# Patient Record
Sex: Female | Born: 1960 | Race: White | Hispanic: No | Marital: Married | Smoking: Never smoker
Health system: Southern US, Community
[De-identification: ages and names within clinical notes are randomized; demographics above are authoritative.]

## PROBLEM LIST (undated history)

## (undated) DIAGNOSIS — D649 Anemia, unspecified: Secondary | ICD-10-CM

## (undated) DIAGNOSIS — G4730 Sleep apnea, unspecified: Secondary | ICD-10-CM

## (undated) DIAGNOSIS — E119 Type 2 diabetes mellitus without complications: Secondary | ICD-10-CM

## (undated) DIAGNOSIS — N189 Chronic kidney disease, unspecified: Secondary | ICD-10-CM

## (undated) DIAGNOSIS — M109 Gout, unspecified: Secondary | ICD-10-CM

## (undated) DIAGNOSIS — M1990 Unspecified osteoarthritis, unspecified site: Secondary | ICD-10-CM

## (undated) DIAGNOSIS — E079 Disorder of thyroid, unspecified: Secondary | ICD-10-CM

## (undated) DIAGNOSIS — I10 Essential (primary) hypertension: Secondary | ICD-10-CM

## (undated) DIAGNOSIS — E039 Hypothyroidism, unspecified: Secondary | ICD-10-CM

## (undated) HISTORY — PX: EYE SURGERY: SHX253

## (undated) HISTORY — PX: WISDOM TOOTH EXTRACTION: SHX21

## (undated) HISTORY — PX: RENAL BIOPSY: SHX156

---

## 2003-12-01 ENCOUNTER — Ambulatory Visit (HOSPITAL_COMMUNITY): Admission: RE | Admit: 2003-12-01 | Discharge: 2003-12-01 | Payer: Self-pay | Admitting: Family Medicine

## 2004-04-09 ENCOUNTER — Other Ambulatory Visit: Admission: RE | Admit: 2004-04-09 | Discharge: 2004-04-09 | Payer: Self-pay | Admitting: Family Medicine

## 2004-12-10 ENCOUNTER — Ambulatory Visit (HOSPITAL_COMMUNITY): Admission: RE | Admit: 2004-12-10 | Discharge: 2004-12-10 | Payer: Self-pay | Admitting: Family Medicine

## 2004-12-24 ENCOUNTER — Encounter: Admission: RE | Admit: 2004-12-24 | Discharge: 2004-12-24 | Payer: Self-pay | Admitting: Family Medicine

## 2005-05-25 ENCOUNTER — Other Ambulatory Visit: Admission: RE | Admit: 2005-05-25 | Discharge: 2005-05-25 | Payer: Self-pay | Admitting: Family Medicine

## 2006-01-02 ENCOUNTER — Ambulatory Visit (HOSPITAL_COMMUNITY): Admission: RE | Admit: 2006-01-02 | Discharge: 2006-01-02 | Payer: Self-pay | Admitting: Family Medicine

## 2006-06-20 ENCOUNTER — Other Ambulatory Visit: Admission: RE | Admit: 2006-06-20 | Discharge: 2006-06-20 | Payer: Self-pay | Admitting: Family Medicine

## 2007-01-12 ENCOUNTER — Ambulatory Visit (HOSPITAL_COMMUNITY): Admission: RE | Admit: 2007-01-12 | Discharge: 2007-01-12 | Payer: Self-pay | Admitting: Family Medicine

## 2007-08-21 ENCOUNTER — Other Ambulatory Visit: Admission: RE | Admit: 2007-08-21 | Discharge: 2007-08-21 | Payer: Self-pay | Admitting: Family Medicine

## 2007-08-28 ENCOUNTER — Ambulatory Visit (HOSPITAL_COMMUNITY): Admission: RE | Admit: 2007-08-28 | Discharge: 2007-08-28 | Payer: Self-pay | Admitting: Family Medicine

## 2008-01-17 ENCOUNTER — Ambulatory Visit (HOSPITAL_COMMUNITY): Admission: RE | Admit: 2008-01-17 | Discharge: 2008-01-17 | Payer: Self-pay | Admitting: Family Medicine

## 2008-09-05 ENCOUNTER — Other Ambulatory Visit: Admission: RE | Admit: 2008-09-05 | Discharge: 2008-09-05 | Payer: Self-pay | Admitting: Family Medicine

## 2009-01-20 ENCOUNTER — Ambulatory Visit (HOSPITAL_COMMUNITY): Admission: RE | Admit: 2009-01-20 | Discharge: 2009-01-20 | Payer: Self-pay | Admitting: Family Medicine

## 2009-09-16 ENCOUNTER — Encounter: Admission: RE | Admit: 2009-09-16 | Discharge: 2009-09-16 | Payer: Self-pay | Admitting: Ophthalmology

## 2010-01-28 ENCOUNTER — Ambulatory Visit (HOSPITAL_COMMUNITY): Admission: RE | Admit: 2010-01-28 | Discharge: 2010-01-28 | Payer: Self-pay | Admitting: Family Medicine

## 2010-10-24 ENCOUNTER — Encounter: Payer: Self-pay | Admitting: Family Medicine

## 2010-11-26 ENCOUNTER — Other Ambulatory Visit (HOSPITAL_COMMUNITY)
Admission: RE | Admit: 2010-11-26 | Discharge: 2010-11-26 | Disposition: A | Discharge status: Home / Self Care | Payer: Commercial Managed Care - PPO | Source: Ambulatory Visit | Attending: Family Medicine | Admitting: Family Medicine

## 2010-11-26 ENCOUNTER — Other Ambulatory Visit: Payer: Self-pay | Admitting: Family Medicine

## 2010-11-26 DIAGNOSIS — Z0000 Encounter for general adult medical examination without abnormal findings: Secondary | ICD-10-CM | POA: Insufficient documentation

## 2010-12-21 ENCOUNTER — Other Ambulatory Visit (HOSPITAL_COMMUNITY): Payer: Self-pay | Admitting: Family Medicine

## 2010-12-21 DIAGNOSIS — Z1231 Encounter for screening mammogram for malignant neoplasm of breast: Secondary | ICD-10-CM

## 2011-02-04 ENCOUNTER — Ambulatory Visit (HOSPITAL_COMMUNITY)
Admission: RE | Admit: 2011-02-04 | Discharge: 2011-02-04 | Disposition: A | Discharge status: Home / Self Care | Payer: Commercial Managed Care - PPO | Source: Ambulatory Visit | Attending: Family Medicine | Admitting: Family Medicine

## 2011-02-04 DIAGNOSIS — Z1231 Encounter for screening mammogram for malignant neoplasm of breast: Secondary | ICD-10-CM | POA: Insufficient documentation

## 2012-01-18 ENCOUNTER — Other Ambulatory Visit (HOSPITAL_COMMUNITY): Payer: Self-pay | Admitting: Family Medicine

## 2012-01-18 DIAGNOSIS — Z1231 Encounter for screening mammogram for malignant neoplasm of breast: Secondary | ICD-10-CM

## 2012-02-14 ENCOUNTER — Ambulatory Visit (HOSPITAL_COMMUNITY): Payer: Commercial Managed Care - PPO

## 2012-03-13 ENCOUNTER — Ambulatory Visit (HOSPITAL_COMMUNITY)
Admission: RE | Admit: 2012-03-13 | Discharge: 2012-03-13 | Disposition: A | Discharge status: Home / Self Care | Payer: Commercial Managed Care - PPO | Source: Ambulatory Visit | Attending: Family Medicine | Admitting: Family Medicine

## 2012-03-13 DIAGNOSIS — Z1231 Encounter for screening mammogram for malignant neoplasm of breast: Secondary | ICD-10-CM | POA: Insufficient documentation

## 2012-10-25 ENCOUNTER — Other Ambulatory Visit: Payer: Self-pay | Admitting: Nephrology

## 2012-10-25 DIAGNOSIS — R3129 Other microscopic hematuria: Secondary | ICD-10-CM

## 2012-11-02 ENCOUNTER — Ambulatory Visit
Admission: RE | Admit: 2012-11-02 | Discharge: 2012-11-02 | Disposition: A | Discharge status: Home / Self Care | Payer: Commercial Managed Care - PPO | Source: Ambulatory Visit | Attending: Nephrology | Admitting: Nephrology

## 2012-11-02 DIAGNOSIS — R3129 Other microscopic hematuria: Secondary | ICD-10-CM

## 2012-11-30 ENCOUNTER — Other Ambulatory Visit (HOSPITAL_COMMUNITY): Payer: Self-pay | Admitting: Nephrology

## 2012-12-06 ENCOUNTER — Other Ambulatory Visit: Payer: Self-pay | Admitting: Radiology

## 2012-12-10 ENCOUNTER — Encounter (HOSPITAL_COMMUNITY): Payer: Self-pay

## 2012-12-14 ENCOUNTER — Encounter (HOSPITAL_COMMUNITY): Payer: Self-pay

## 2012-12-14 ENCOUNTER — Observation Stay (HOSPITAL_COMMUNITY)
Admission: RE | Admit: 2012-12-14 | Discharge: 2012-12-15 | Disposition: A | Discharge status: Home / Self Care | Payer: Commercial Managed Care - PPO | Source: Ambulatory Visit | Attending: Interventional Radiology | Admitting: Interventional Radiology

## 2012-12-14 VITALS — BP 137/92 | HR 96 | Temp 98.1°F | Resp 16 | Ht 71.0 in | Wt 201.7 lb

## 2012-12-14 DIAGNOSIS — N183 Chronic kidney disease, stage 3 unspecified: Secondary | ICD-10-CM | POA: Insufficient documentation

## 2012-12-14 DIAGNOSIS — R031 Nonspecific low blood-pressure reading: Secondary | ICD-10-CM | POA: Insufficient documentation

## 2012-12-14 DIAGNOSIS — N059 Unspecified nephritic syndrome with unspecified morphologic changes: Principal | ICD-10-CM | POA: Insufficient documentation

## 2012-12-14 DIAGNOSIS — I129 Hypertensive chronic kidney disease with stage 1 through stage 4 chronic kidney disease, or unspecified chronic kidney disease: Secondary | ICD-10-CM | POA: Insufficient documentation

## 2012-12-14 DIAGNOSIS — R809 Proteinuria, unspecified: Secondary | ICD-10-CM | POA: Insufficient documentation

## 2012-12-14 DIAGNOSIS — M109 Gout, unspecified: Secondary | ICD-10-CM | POA: Insufficient documentation

## 2012-12-14 DIAGNOSIS — E039 Hypothyroidism, unspecified: Secondary | ICD-10-CM | POA: Insufficient documentation

## 2012-12-14 HISTORY — DX: Disorder of thyroid, unspecified: E07.9

## 2012-12-14 HISTORY — DX: Chronic kidney disease, unspecified: N18.9

## 2012-12-14 HISTORY — DX: Essential (primary) hypertension: I10

## 2012-12-14 HISTORY — DX: Hypothyroidism, unspecified: E03.9

## 2012-12-14 HISTORY — DX: Gout, unspecified: M10.9

## 2012-12-14 LAB — APTT: aPTT: 29 seconds (ref 24–37)

## 2012-12-14 LAB — PROTIME-INR: INR: 0.96 (ref 0.00–1.49)

## 2012-12-14 LAB — CBC
MCH: 31.5 pg (ref 26.0–34.0)
MCHC: 35.4 g/dL (ref 30.0–36.0)
MCV: 90.9 fL (ref 78.0–100.0)
Platelets: 200 10*3/uL (ref 150–400)
Platelets: 179 10*3/uL (ref 150–400)
RBC: 3.95 MIL/uL (ref 3.87–5.11)
RBC: 3.4 MIL/uL — ABNORMAL LOW (ref 3.87–5.11)
RDW: 13 % (ref 11.5–15.5)
WBC: 5.7 10*3/uL (ref 4.0–10.5)

## 2012-12-14 LAB — TYPE AND SCREEN
ABO/RH(D): O POS
Antibody Screen: NEGATIVE

## 2012-12-14 LAB — ABO/RH: ABO/RH(D): O POS

## 2012-12-14 LAB — HEMOGLOBIN: Hemoglobin: 9.9 g/dL — ABNORMAL LOW (ref 12.0–15.0)

## 2012-12-14 MED ORDER — HYDROCODONE-ACETAMINOPHEN 5-325 MG PO TABS: 1.0000 | ORAL_TABLET | Freq: Four times a day (QID) | ORAL | Status: DC | PRN

## 2012-12-14 MED ORDER — MIDAZOLAM HCL 2 MG/2ML IJ SOLN
INTRAMUSCULAR | Status: AC | PRN
  Administered 2012-12-14 (×2): 1 mg via INTRAVENOUS

## 2012-12-14 MED ORDER — SODIUM CHLORIDE 0.9 % IV SOLN: INTRAVENOUS | Status: DC

## 2012-12-14 MED ORDER — SODIUM CHLORIDE 0.9 % IV SOLN: Freq: Once | INTRAVENOUS | Status: DC

## 2012-12-14 MED ORDER — LOSARTAN POTASSIUM 50 MG PO TABS
100.0000 mg | ORAL_TABLET | Freq: Every day | ORAL | Status: DC
  Administered 2012-12-14 – 2012-12-15 (×2): 100 mg via ORAL
  Filled 2012-12-14 (×2): qty 2

## 2012-12-14 MED ORDER — ALLOPURINOL 300 MG PO TABS
300.0000 mg | ORAL_TABLET | Freq: Every day | ORAL | Status: DC
  Administered 2012-12-14 – 2012-12-15 (×2): 300 mg via ORAL
  Filled 2012-12-14 (×2): qty 1

## 2012-12-14 MED ORDER — AMLODIPINE BESYLATE 5 MG PO TABS
5.0000 mg | ORAL_TABLET | Freq: Every day | ORAL | Status: DC
  Administered 2012-12-14 – 2012-12-15 (×2): 5 mg via ORAL
  Filled 2012-12-14 (×2): qty 1

## 2012-12-14 MED ORDER — SODIUM CHLORIDE 0.9 % IV SOLN
INTRAVENOUS | Status: DC
  Administered 2012-12-14: 14:00:00 via INTRAVENOUS

## 2012-12-14 MED ORDER — FENTANYL CITRATE 0.05 MG/ML IJ SOLN
INTRAMUSCULAR | Status: AC | PRN
  Administered 2012-12-14: 50 ug via INTRAVENOUS
  Administered 2012-12-14: 25 ug via INTRAVENOUS

## 2012-12-14 MED ORDER — COLCHICINE 0.6 MG PO TABS
0.6000 mg | ORAL_TABLET | Freq: Every day | ORAL | Status: DC | PRN
  Filled 2012-12-14: qty 1

## 2012-12-14 MED ORDER — SODIUM CHLORIDE 0.9 % IV SOLN
INTRAVENOUS | Status: AC | PRN
  Administered 2012-12-14: 20 mL/h via INTRAVENOUS

## 2012-12-14 MED ORDER — FENTANYL CITRATE 0.05 MG/ML IJ SOLN
INTRAMUSCULAR | Status: AC
  Filled 2012-12-14: qty 2

## 2012-12-14 MED ORDER — MIDAZOLAM HCL 2 MG/2ML IJ SOLN
INTRAMUSCULAR | Status: AC
  Filled 2012-12-14: qty 4

## 2012-12-14 MED ORDER — LORATADINE 10 MG PO TABS
10.0000 mg | ORAL_TABLET | Freq: Every day | ORAL | Status: DC | PRN
  Filled 2012-12-14: qty 1

## 2012-12-14 MED ORDER — ADULT MULTIVITAMIN W/MINERALS CH
1.0000 | ORAL_TABLET | Freq: Every day | ORAL | Status: DC
  Administered 2012-12-14: 1 via ORAL
  Filled 2012-12-14 (×2): qty 1

## 2012-12-14 MED ORDER — METOPROLOL TARTRATE 25 MG PO TABS
25.0000 mg | ORAL_TABLET | Freq: Every day | ORAL | Status: DC
  Administered 2012-12-14 – 2012-12-15 (×2): 25 mg via ORAL
  Filled 2012-12-14 (×2): qty 1

## 2012-12-14 NOTE — ED Notes (Signed)
NS at 125cc per hour. Had 500cc NS bolus

## 2012-12-14 NOTE — Procedures (Signed)
US renal core bx R 16g x3 No complication No blood loss. See complete dictation in [facility].

## 2012-12-14 NOTE — H&P (Signed)
Stacey Mullins is an 52 y.o. female.   Chief Complaint: proteinuria Pt has had hx microscopic hematuria for 15 yrs Recent blood work and 24 hr urine : abnormal Referred to Dr Caryn Section for evaluation Scheduled now for random renal biopsy HPI: HTN; gout; hypothyroid  Past Medical History  Diagnosis Date  . Hypertension   . Chronic kidney disease   . Gout     History reviewed. No pertinent past surgical history.  No family history on file. Social History:  has no tobacco, alcohol, and drug history on file.  Allergies:  Allergies  Allergen Reactions  . Benicar (Olmesartan)     Coughing   . Erythromycin     Thrush     (Not in a hospital admission)  Results for orders placed during the hospital encounter of 12/14/12 (from the past 48 hour(s))  CBC     Status: Abnormal   Collection Time    12/14/12  8:37 AM      Result Value Range   WBC 5.7  4.0 - 10.5 K/uL   RBC 3.95  3.87 - 5.11 MIL/uL   Hemoglobin 12.4  12.0 - 15.0 g/dL   HCT 19.1 (*) 47.8 - 29.5 %   MCV 90.9  78.0 - 100.0 fL   MCH 31.4  26.0 - 34.0 pg   MCHC 34.5  30.0 - 36.0 g/dL   RDW 62.1  30.8 - 65.7 %   Platelets 200  150 - 400 K/uL   No results found.  Review of Systems  Constitutional: Negative for fever.  Respiratory: Negative for cough and shortness of breath.   Cardiovascular: Negative for chest pain.  Gastrointestinal: Negative for nausea and vomiting.  Genitourinary: Positive for hematuria.       Proteinuria  Neurological: Negative for headaches.    There were no vitals taken for this visit. Physical Exam  Constitutional: She is oriented to person, place, and time. She appears well-developed and well-nourished.  Cardiovascular: Normal rate, regular rhythm and normal heart sounds.   Respiratory: Effort normal and breath sounds normal. She has no wheezes.  GI: Soft. Bowel sounds are normal. There is no tenderness.  Musculoskeletal: Normal range of motion.  Neurological: She is alert and oriented to  person, place, and time.  Skin: Skin is warm and dry.  Psychiatric: She has a normal mood and affect. Her behavior is normal. Judgment and thought content normal.     Assessment/Plan Proteinuria Scheduled for renal bx today Pt aware of procedure benefits and risks and agreeable to proceed Consent signed and in chart  Tinea Nobile A 12/14/2012, 10:10 AM

## 2012-12-14 NOTE — ED Notes (Addendum)
In nurses station with husband. Report given to Konrad Saha. Bandaid right flank clean dry and intact

## 2012-12-14 NOTE — Progress Notes (Addendum)
Patient ID: Stacey Mullins, female   DOB: 1960/10/25, 52 y.o.   MRN: [id number]   Rt renal hematoma post renal bx  Admitted to IR service overnight for pain control And observation  Plan for dc in am if stable Dr Allena Katz aware

## 2012-12-15 LAB — CBC
HCT: 29.8 % — ABNORMAL LOW (ref 36.0–46.0)
Hemoglobin: 10 g/dL — ABNORMAL LOW (ref 12.0–15.0)
MCH: 31 pg (ref 26.0–34.0)
MCHC: 33.6 g/dL (ref 30.0–36.0)
MCV: 92.3 fL (ref 78.0–100.0)
Platelets: 187 10*3/uL (ref 150–400)
RBC: 3.23 MIL/uL — ABNORMAL LOW (ref 3.87–5.11)
RDW: 13.3 % (ref 11.5–15.5)
WBC: 8.5 10*3/uL (ref 4.0–10.5)

## 2012-12-15 LAB — BASIC METABOLIC PANEL
BUN: 35 mg/dL — ABNORMAL HIGH (ref 6–23)
Creatinine, Ser: 1.37 mg/dL — ABNORMAL HIGH (ref 0.50–1.10)
GFR calc Af Amer: 52 mL/min — ABNORMAL LOW (ref >90)
GFR calc non Af Amer: 44 mL/min — ABNORMAL LOW (ref >90)
Potassium: 4 mEq/L (ref 3.5–5.1)

## 2012-12-15 MED ORDER — LEVOTHYROXINE SODIUM 137 MCG PO TABS
137.0000 ug | ORAL_TABLET | Freq: Every day | ORAL | Status: DC
  Filled 2012-12-15: qty 1

## 2012-12-15 NOTE — Discharge Summary (Signed)
Physician Discharge Summary  Patient ID: Stacey Mullins MRN: [id number] DOB/AGE: 1960/12/16 52 y.o.  Admit date: 12/14/2012 Discharge date: 12/15/2012  Admission Diagnoses: Right perinephric hematoma status post ultrasound guided random core right renal biopsy 12/14/2012  Discharge Diagnoses: Right perinephric hematoma, stable, status post ultrasound guided random core right renal biopsy 12/14/2012 Past Medical History  Diagnosis Date  . Hypertension   . Gout   . Thyroid disease   . Hypothyroid   . Chronic kidney disease     proteinuria     Discharged Condition: good  Hospital Course: Mrs. Stacey Mullins is a 52 year old white female who was referred to the interventional radiology service by Dr. Marina Gravel for ultrasound guided random core renal biopsy. She has a history of hypertension as well as microscopic hematuria, renal insufficiency and proteinuria. On 12/14/2012 the patient underwent ultrasound-guided random core biopsy of the right kidney by Dr. Oley Balm. The procedure was performed via IV conscious sedation. Post procedure the patient was noted to have a right perinephric hematoma on follow up ultrasound. She also complained of mild to moderate abdominal and right flank discomfort. There was no evidence of hematuria. The patient was subsequently admitted to the hospital for overnight observation and IV fluids. She did well overnight with minimal abdominal and right flank pain. Serial hemoglobins were obtained with lowest being 8.8. On the day of discharge , followup hemoglobin was 10.0. Renal function was stable. She complained of 2/10 upper abdominal and right flank discomfort. There was no evidence of gross hematuria or dysuria. Patient denied any nausea or vomiting or chest pain. She tolerated her diet well and was able to void and ambulate without difficulty. Following discussion with Dr. Bonnielee Haff ( interventional radiologist )and  Dr. Allena Katz from the nephrology service, decision was made  to discharge the patient home. Patient was instructed to avoid any heavy lifting for the next 3-4 days and contact Dr. Caryn Section before restarting any blood thinning medication.  She was told to contact  radiology or Dr. Caryn Section with any questions or concerns. She will followup with Dr. Caryn Section as an outpatient regarding renal biopsy results.  Consults: none  Significant Diagnostic Studies:  Results for orders placed during the hospital encounter of 12/14/12  APTT      Result Value Range   aPTT 29  24 - 37 seconds  CBC      Result Value Range   WBC 5.7  4.0 - 10.5 K/uL   RBC 3.95  3.87 - 5.11 MIL/uL   Hemoglobin 12.4  12.0 - 15.0 g/dL   HCT 11.9 (*) 14.7 - 82.9 %   MCV 90.9  78.0 - 100.0 fL   MCH 31.4  26.0 - 34.0 pg   MCHC 34.5  30.0 - 36.0 g/dL   RDW 56.2  13.0 - 86.5 %   Platelets 200  150 - 400 K/uL  PROTIME-INR      Result Value Range   Prothrombin Time 12.7  11.6 - 15.2 seconds   INR 0.96  0.00 - 1.49  CBC      Result Value Range   WBC 10.8 (*) 4.0 - 10.5 K/uL   RBC 3.40 (*) 3.87 - 5.11 MIL/uL   Hemoglobin 10.7 (*) 12.0 - 15.0 g/dL   HCT 78.4 (*) 69.6 - 29.5 %   MCV 88.8  78.0 - 100.0 fL   MCH 31.5  26.0 - 34.0 pg   MCHC 35.4  30.0 - 36.0 g/dL   RDW 28.4  13.2 -  15.5 %   Platelets 179  150 - 400 K/uL  HEMOGLOBIN      Result Value Range   Hemoglobin 9.9 (*) 12.0 - 15.0 g/dL  HEMOGLOBIN      Result Value Range   Hemoglobin 8.8 (*) 12.0 - 15.0 g/dL  CBC      Result Value Range   WBC 8.5  4.0 - 10.5 K/uL   RBC 3.23 (*) 3.87 - 5.11 MIL/uL   Hemoglobin 10.0 (*) 12.0 - 15.0 g/dL   HCT 47.8 (*) 29.5 - 62.1 %   MCV 92.3  78.0 - 100.0 fL   MCH 31.0  26.0 - 34.0 pg   MCHC 33.6  30.0 - 36.0 g/dL   RDW 30.8  65.7 - 84.6 %   Platelets 187  150 - 400 K/uL  BASIC METABOLIC PANEL      Result Value Range   Sodium 141  135 - 145 mEq/L   Potassium 4.0  3.5 - 5.1 mEq/L   Chloride 107  96 - 112 mEq/L   CO2 24  19 - 32 mEq/L   Glucose, Bld 96  70 - 99 mg/dL   BUN 35 (*) 6 - 23 mg/dL    Creatinine, Ser 9.62 (*) 0.50 - 1.10 mg/dL   Calcium 8.5  8.4 - 95.2 mg/dL   GFR calc non Af Amer 44 (*) >90 mL/min   GFR calc Af Amer 52 (*) >90 mL/min  TYPE AND SCREEN      Result Value Range   ABO/RH(D) O POS     Antibody Screen NEG     Sample Expiration 12/17/2012    ABO/RH      Result Value Range   ABO/RH(D) O POS       Treatments: Ultrasound-guided random core right renal biopsy on 12/14/2012 via IV conscious sedation   Discharge Exam: Blood pressure 137/92, pulse 96, temperature 98.1 F (36.7 C), temperature source Oral, resp. rate 16, height 5\' 11"  (1.803 m), weight 201 lb 11.2 oz (91.491 kg), SpO2 97.00%. The patient is awake, alert and oriented. Chest is clear to auscultation with only minimal diminished breath sounds at right base. Heart with regular rate and rhythm. Abdomen soft, positive bowel sounds, mild tenderness noted across upper abdominal/epigastric and right flank regions. Puncture site right flank clean and dry, there is  no  ecchymosis. Extremities with full range of motion and no edema. .  Disposition:Home  Discharge Orders   Future Orders Complete By Expires     Call MD for:  difficulty breathing, headache or visual disturbances  As directed     Call MD for:  extreme fatigue  As directed     Call MD for:  hives  As directed     Call MD for:  persistant dizziness or light-headedness  As directed     Call MD for:  persistant nausea and vomiting  As directed     Call MD for:  redness, tenderness, or signs of infection (pain, swelling, redness, odor or green/yellow discharge around incision site)  As directed     Call MD for:  severe uncontrolled pain  As directed     Call MD for:  temperature >100.4  As directed     Change dressing (specify)  As directed     Comments:      May apply new bandaid over right flank puncture site daily for next 2 days. May wash site with soap and water.    Diet - low sodium  heart healthy  As directed     Discharge instructions   As directed     Comments:      Call radiology dept at [phone] or Dr. Scherrie Gerlach office at [phone] with any questions or concerns. Contact Dr. Scherrie Gerlach office before restarting any blood thinners. May resume usual home medications.    Discharge patient  As directed     Driving Restrictions  As directed     Comments:      No driving for next 48 hours    Increase activity slowly  As directed     Lifting restrictions  As directed     Comments:      No heavy lifting for next 3-4 days        Medication List    TAKE these medications       allopurinol 300 MG tablet  Commonly known as:  ZYLOPRIM  Take 300 mg by mouth daily.     amLODipine 5 MG tablet  Commonly known as:  NORVASC  Take 5 mg by mouth daily.     colchicine 0.6 MG tablet  Take 0.6 mg by mouth daily as needed (for gout flare up).     fluticasone 50 MCG/ACT nasal spray  Commonly known as:  FLONASE  Place 2 sprays into the nose daily as needed for allergies.     loratadine 10 MG tablet  Commonly known as:  CLARITIN  Take 10 mg by mouth daily as needed for allergies.     losartan 100 MG tablet  Commonly known as:  COZAAR  Take 100 mg by mouth daily.     metoprolol tartrate 25 MG tablet  Commonly known as:  LOPRESSOR  Take 25 mg by mouth daily.     multivitamin with minerals Tabs  Take 1 tablet by mouth daily.     Vitamin D3 2000 UNITS capsule  Take 2,000 Units by mouth daily.      May take regularly prescribed synthroid dose.     Follow-up Information   Follow up with Zada Girt, MD. (contact Dr. Caryn Section with any questions or concerns at [phone])    Contact information:   [address] [facility] [place] [address] [phone]       Signed: Chinita Pester 12/15/2012, 11:06 AM

## 2012-12-15 NOTE — Progress Notes (Signed)
UR completed 

## 2012-12-15 NOTE — Progress Notes (Signed)
Pt given discharge instructions.  Taken to ride via wheel chair.  Denied any complaints or needs.

## 2013-01-01 ENCOUNTER — Other Ambulatory Visit (HOSPITAL_COMMUNITY)
Admission: RE | Admit: 2013-01-01 | Discharge: 2013-01-01 | Disposition: A | Discharge status: Home / Self Care | Payer: Commercial Managed Care - PPO | Source: Ambulatory Visit | Attending: Family Medicine | Admitting: Family Medicine

## 2013-01-01 ENCOUNTER — Other Ambulatory Visit: Payer: Self-pay | Admitting: Family Medicine

## 2013-01-01 DIAGNOSIS — Z0000 Encounter for general adult medical examination without abnormal findings: Secondary | ICD-10-CM | POA: Insufficient documentation

## 2013-01-04 ENCOUNTER — Other Ambulatory Visit (HOSPITAL_COMMUNITY): Payer: Self-pay | Admitting: *Deleted

## 2013-01-07 ENCOUNTER — Encounter (HOSPITAL_COMMUNITY)
Admission: RE | Admit: 2013-01-07 | Discharge: 2013-01-07 | Disposition: A | Discharge status: Home / Self Care | Payer: Commercial Managed Care - PPO | Source: Ambulatory Visit | Attending: Nephrology | Admitting: Nephrology

## 2013-01-07 DIAGNOSIS — R809 Proteinuria, unspecified: Secondary | ICD-10-CM | POA: Insufficient documentation

## 2013-01-07 DIAGNOSIS — M109 Gout, unspecified: Secondary | ICD-10-CM | POA: Insufficient documentation

## 2013-01-07 MED ORDER — SODIUM CHLORIDE 0.9 % IV SOLN
500.0000 mg | INTRAVENOUS | Status: DC
  Administered 2013-01-07: 500 mg via INTRAVENOUS
  Filled 2013-01-07: qty 4

## 2013-01-08 ENCOUNTER — Encounter (HOSPITAL_COMMUNITY)
Admission: RE | Admit: 2013-01-08 | Discharge: 2013-01-08 | Disposition: A | Discharge status: Home / Self Care | Payer: Commercial Managed Care - PPO | Source: Ambulatory Visit | Attending: Nephrology | Admitting: Nephrology

## 2013-01-08 MED ORDER — SODIUM CHLORIDE 0.9 % IV SOLN
500.0000 mg | INTRAVENOUS | Status: AC
  Administered 2013-01-08: 500 mg via INTRAVENOUS
  Filled 2013-01-08: qty 4

## 2013-02-14 ENCOUNTER — Other Ambulatory Visit (HOSPITAL_COMMUNITY): Payer: Self-pay | Admitting: Family Medicine

## 2013-02-14 DIAGNOSIS — Z1231 Encounter for screening mammogram for malignant neoplasm of breast: Secondary | ICD-10-CM

## 2013-03-01 ENCOUNTER — Other Ambulatory Visit (HOSPITAL_COMMUNITY): Payer: Self-pay | Admitting: *Deleted

## 2013-03-04 ENCOUNTER — Encounter (HOSPITAL_COMMUNITY)
Admission: RE | Admit: 2013-03-04 | Discharge: 2013-03-04 | Disposition: A | Discharge status: Home / Self Care | Payer: Commercial Managed Care - PPO | Source: Ambulatory Visit | Attending: Nephrology | Admitting: Nephrology

## 2013-03-04 DIAGNOSIS — M109 Gout, unspecified: Secondary | ICD-10-CM | POA: Insufficient documentation

## 2013-03-04 DIAGNOSIS — R809 Proteinuria, unspecified: Secondary | ICD-10-CM | POA: Insufficient documentation

## 2013-03-04 MED ORDER — SODIUM CHLORIDE 0.9 % IV SOLN
500.0000 mg | Freq: Every day | INTRAVENOUS | Status: DC
  Administered 2013-03-04: 500 mg via INTRAVENOUS
  Filled 2013-03-04: qty 4

## 2013-03-05 ENCOUNTER — Encounter (HOSPITAL_COMMUNITY)
Admission: RE | Admit: 2013-03-05 | Discharge: 2013-03-05 | Disposition: A | Discharge status: Home / Self Care | Payer: Commercial Managed Care - PPO | Source: Ambulatory Visit | Attending: Nephrology | Admitting: Nephrology

## 2013-03-05 MED ORDER — SODIUM CHLORIDE 0.9 % IV SOLN
500.0000 mg | Freq: Every day | INTRAVENOUS | Status: DC
  Administered 2013-03-05: 500 mg via INTRAVENOUS
  Filled 2013-03-05: qty 4

## 2013-03-19 ENCOUNTER — Ambulatory Visit (HOSPITAL_COMMUNITY)
Admission: RE | Admit: 2013-03-19 | Discharge: 2013-03-19 | Disposition: A | Discharge status: Home / Self Care | Payer: Commercial Managed Care - PPO | Source: Ambulatory Visit | Attending: Family Medicine | Admitting: Family Medicine

## 2013-03-19 DIAGNOSIS — Z1231 Encounter for screening mammogram for malignant neoplasm of breast: Secondary | ICD-10-CM | POA: Insufficient documentation

## 2013-08-08 ENCOUNTER — Other Ambulatory Visit: Payer: Self-pay

## 2013-08-30 ENCOUNTER — Observation Stay (HOSPITAL_COMMUNITY): Payer: Commercial Managed Care - PPO

## 2013-08-30 ENCOUNTER — Encounter (HOSPITAL_COMMUNITY): Payer: Self-pay | Admitting: Emergency Medicine

## 2013-08-30 ENCOUNTER — Emergency Department (HOSPITAL_COMMUNITY): Payer: Commercial Managed Care - PPO

## 2013-08-30 ENCOUNTER — Inpatient Hospital Stay (HOSPITAL_COMMUNITY)
Admission: EM | Admit: 2013-08-30 | Discharge: 2013-08-31 | DRG: 071 | Disposition: A | Discharge status: Home / Self Care | Payer: Commercial Managed Care - PPO | Attending: Family Medicine | Admitting: Family Medicine

## 2013-08-30 DIAGNOSIS — I519 Heart disease, unspecified: Secondary | ICD-10-CM

## 2013-08-30 DIAGNOSIS — E039 Hypothyroidism, unspecified: Secondary | ICD-10-CM | POA: Diagnosis present

## 2013-08-30 DIAGNOSIS — I10 Essential (primary) hypertension: Secondary | ICD-10-CM | POA: Diagnosis present

## 2013-08-30 DIAGNOSIS — M109 Gout, unspecified: Secondary | ICD-10-CM | POA: Diagnosis present

## 2013-08-30 DIAGNOSIS — G459 Transient cerebral ischemic attack, unspecified: Secondary | ICD-10-CM | POA: Diagnosis present

## 2013-08-30 DIAGNOSIS — N059 Unspecified nephritic syndrome with unspecified morphologic changes: Secondary | ICD-10-CM | POA: Diagnosis present

## 2013-08-30 DIAGNOSIS — E785 Hyperlipidemia, unspecified: Secondary | ICD-10-CM | POA: Diagnosis present

## 2013-08-30 DIAGNOSIS — G454 Transient global amnesia: Principal | ICD-10-CM | POA: Diagnosis present

## 2013-08-30 DIAGNOSIS — R404 Transient alteration of awareness: Secondary | ICD-10-CM

## 2013-08-30 LAB — URINE MICROSCOPIC-ADD ON

## 2013-08-30 LAB — APTT: aPTT: 27 seconds (ref 24–37)

## 2013-08-30 LAB — CBC
Hemoglobin: 12.6 g/dL (ref 12.0–15.0)
MCH: 32.3 pg (ref 26.0–34.0)
Platelets: 181 10*3/uL (ref 150–400)
RBC: 3.9 MIL/uL (ref 3.87–5.11)
WBC: 8.9 10*3/uL (ref 4.0–10.5)

## 2013-08-30 LAB — COMPREHENSIVE METABOLIC PANEL
ALT: 14 U/L (ref 0–35)
Albumin: 3.2 g/dL — ABNORMAL LOW (ref 3.5–5.2)
Alkaline Phosphatase: 81 U/L (ref 39–117)
Chloride: 106 mEq/L (ref 96–112)
Glucose, Bld: 94 mg/dL (ref 70–99)
Potassium: 3.7 mEq/L (ref 3.5–5.1)
Sodium: 142 mEq/L (ref 135–145)
Total Bilirubin: 0.3 mg/dL (ref 0.3–1.2)
Total Protein: 6.3 g/dL (ref 6.0–8.3)

## 2013-08-30 LAB — DIFFERENTIAL
Basophils Relative: 0 % (ref 0–1)
Eosinophils Absolute: 0 10*3/uL (ref 0.0–0.7)
Lymphs Abs: 1.5 10*3/uL (ref 0.7–4.0)
Monocytes Relative: 7 % (ref 3–12)
Neutro Abs: 6.8 10*3/uL (ref 1.7–7.7)
Neutrophils Relative %: 76 % (ref 43–77)

## 2013-08-30 LAB — PROTIME-INR
INR: 1.1 (ref 0.00–1.49)
Prothrombin Time: 14 seconds (ref 11.6–15.2)

## 2013-08-30 LAB — URINALYSIS, ROUTINE W REFLEX MICROSCOPIC
Bilirubin Urine: NEGATIVE
Ketones, ur: NEGATIVE mg/dL
Specific Gravity, Urine: 1.017 (ref 1.005–1.030)
pH: 5 (ref 5.0–8.0)

## 2013-08-30 MED ORDER — METOPROLOL TARTRATE 25 MG PO TABS
25.0000 mg | ORAL_TABLET | Freq: Every day | ORAL | Status: DC
  Administered 2013-08-31: 25 mg via ORAL
  Filled 2013-08-30: qty 1

## 2013-08-30 MED ORDER — ENOXAPARIN SODIUM 40 MG/0.4ML ~~LOC~~ SOLN
40.0000 mg | SUBCUTANEOUS | Status: DC
  Administered 2013-08-30: 40 mg via SUBCUTANEOUS
  Filled 2013-08-30 (×2): qty 0.4

## 2013-08-30 MED ORDER — FLUTICASONE PROPIONATE 50 MCG/ACT NA SUSP
2.0000 | Freq: Every day | NASAL | Status: DC | PRN
  Filled 2013-08-30: qty 16

## 2013-08-30 MED ORDER — ALLOPURINOL 300 MG PO TABS
300.0000 mg | ORAL_TABLET | Freq: Every day | ORAL | Status: DC
Start: 2013-08-30 — End: 2013-08-31
  Administered 2013-08-30 – 2013-08-31 (×2): 300 mg via ORAL
  Filled 2013-08-30 (×2): qty 1

## 2013-08-30 MED ORDER — SENNOSIDES-DOCUSATE SODIUM 8.6-50 MG PO TABS: 1.0000 | ORAL_TABLET | Freq: Every evening | ORAL | Status: DC | PRN

## 2013-08-30 MED ORDER — SODIUM CHLORIDE 0.9 % IV SOLN
INTRAVENOUS | Status: AC
  Administered 2013-08-30: 13:00:00 via INTRAVENOUS

## 2013-08-30 MED ORDER — LEVOTHYROXINE SODIUM 150 MCG PO TABS
150.0000 ug | ORAL_TABLET | Freq: Every day | ORAL | Status: DC
  Administered 2013-08-31: 150 ug via ORAL
  Filled 2013-08-30 (×2): qty 1

## 2013-08-30 NOTE — Progress Notes (Signed)
  Echocardiogram 2D Echocardiogram has been performed.  Stacey Mullins 08/30/2013, 5:15 PM

## 2013-08-30 NOTE — ED Notes (Signed)
Pt sts episode of memory loss today while grocery shopping; pt alert at present; denies any other sx; pt sts recent med change from losartan to valsartan only

## 2013-08-30 NOTE — H&P (Signed)
[facility] History and Physical  WINNA GOLLA ZOX:[id number] DOB: 01/14/1961 DOA: 08/30/2013  Referring physician: EDP    PCP: Cala Bradford, MD  Specialists: none  Chief Complaint: amnesia this morning.   HPI: Stacey Mullins is a 52 y.o. female with h/o hypertension, hypothyroidism, Ig A nephropathycame in to ED after an episode of memory loss around 10 30 this am, and amnesia. As per the patient, she went to the grocery store and after some time, she could not recall where she is and how long she was in the store. She reports the episode lasted for 15 min. She denies any nausea, chest pain, muscle weakness or tingling . She reports some tightness in the back of the neck and some fullness in the ears. Denies similar complaints in the past. Denies stroke in the past . Denies the use of any aspirin. Pt reports memory loss in the past, has a h/o PE at home. Her family at bedside reportsepisodes of expressive aphasia in the recent few months.   Review of Systems: The patient denies anorexia, fever, weight loss,, vision loss, decreased hearing, hoarseness, chest pain, syncope, dyspnea on exertion, peripheral edema, balance deficits, hemoptysis, abdominal pain, melena, hematochezia, severe indigestion/heartburn, hematuria, incontinence, genital sores, muscle weakness, suspicious skin lesions, transient blindness, difficulty walking, depression, unusual weight change, abnormal bleeding, enlarged lymph nodes, angioedema, and breast masses.    Past Medical History  Diagnosis Date  . Hypertension   . Gout   . Thyroid disease   . Hypothyroid   . Chronic kidney disease     proteinuria   History reviewed. No pertinent past surgical history. Social History:  reports that she has never smoked. She has never used smokeless tobacco. She reports that she does not drink alcohol or use illicit drugs.  where does patient live--home,  Allergies  Allergen Reactions  . Benicar [Olmesartan]      Coughing   . Erythromycin     Thrush    Family History  Problem Relation Age of Onset  . Arthritis/Rheumatoid Mother   . Thyroid disease Mother   . Factor V Leiden deficiency Mother   . Hypertension Father    Prior to Admission medications   Medication Sig Start Date End Date Taking? Authorizing Provider  allopurinol (ZYLOPRIM) 300 MG tablet Take 300 mg by mouth daily.   Yes Historical Provider, MD  amLODipine (NORVASC) 5 MG tablet Take 5 mg by mouth daily.   Yes Historical Provider, MD  Cholecalciferol (VITAMIN D3) 2000 UNITS capsule Take 2,000 Units by mouth daily.   Yes Historical Provider, MD  colchicine 0.6 MG tablet Take 0.6 mg by mouth daily as needed (for gout flare up).   Yes Historical Provider, MD  fluticasone (FLONASE) 50 MCG/ACT nasal spray Place 2 sprays into the nose daily as needed for allergies.   Yes Historical Provider, MD  levothyroxine (SYNTHROID, LEVOTHROID) 150 MCG tablet Take 150 mcg by mouth daily before breakfast.   Yes Historical Provider, MD  metoprolol tartrate (LOPRESSOR) 25 MG tablet Take 25 mg by mouth daily.   Yes Historical Provider, MD  Omega-3 Fatty Acids (FISH OIL PO) Take 1 capsule by mouth 2 (two) times daily.   Yes Historical Provider, MD  valsartan (DIOVAN) 320 MG tablet Take 320 mg by mouth daily.   Yes Historical Provider, MD   Physical Exam: Filed Vitals:   08/30/13 1530  BP: 132/88  Pulse: 89  Temp:   Resp: 11    Constitutional: Vital signs reviewed.  Patient is a well-developed and well-nourished  in no acute distress and cooperative with exam. Alert and oriented x3.  Head: Normocephalic and atraumatic Mouth: no erythema or exudates, MMM Eyes: PERRL, EOMI, conjunctivae normal, No scleral icterus.  Neck: Supple, Trachea midline normal ROM, No JVD, mass, thyromegaly, or carotid bruit present.  Cardiovascular: RRR, S1 normal, S2 normal, no MRG, pulses symmetric and intact bilaterally Pulmonary/Chest: normal respiratory effort, CTAB,  no wheezes, rales, or rhonchi Abdominal: Soft. Non-tender, non-distended, bowel sounds are normal, no masses, organomegaly, or guarding present.  Musculoskeletal: No joint deformities, erythema, or stiffness, ROM full and no nontender Neurological: A&O x3, Strength is normal and symmetric bilaterally, cranial nerve II-XII are grossly intact, no focal motor deficit, sensory intact to light touch bilaterally.  Skin: Warm, dry and intact. No rash, cyanosis, or clubbing.  Psychiatric: Normal mood and affect. speech and behavior is normal.   Labs on Admission:  Basic Metabolic Panel:  Recent Labs Lab 08/30/13 1315  NA 142  K 3.7  CL 106  CO2 25  GLUCOSE 94  BUN 30*  CREATININE 1.39*  CALCIUM 8.6   Liver Function Tests:  Recent Labs Lab 08/30/13 1315  AST 21  ALT 14  ALKPHOS 81  BILITOT 0.3  PROT 6.3  ALBUMIN 3.2*   No results found for this basename: LIPASE, AMYLASE,  in the last 168 hours No results found for this basename: AMMONIA,  in the last 168 hours CBC:  Recent Labs Lab 08/30/13 1315  WBC 8.9  NEUTROABS 6.8  HGB 12.6  HCT 35.8*  MCV 91.8  PLT 181   Cardiac Enzymes:  Recent Labs Lab 08/30/13 1316  TROPONINI <0.30    BNP (last 3 results) No results found for this basename: PROBNP,  in the last 8760 hours CBG: No results found for this basename: GLUCAP,  in the last 168 hours  Radiological Exams on Admission: Dg Chest 2 View  08/30/2013   CLINICAL DATA:  52 year old female with recent CVA.  EXAM: CHEST  2 VIEW  COMPARISON:  None.  FINDINGS: The cardiomediastinal silhouette is unremarkable.  Mild elevation of the right hemidiaphragm is noted with mild right basilar atelectasis/ scarring.  There is no evidence of focal airspace disease, pulmonary edema, suspicious pulmonary nodule/mass, pleural effusion, or pneumothorax.  No acute bony abnormalities are identified.  IMPRESSION: Mild right basilar atelectasis/ scarring with mild elevation of the right  hemidiaphragm.  No others significant abnormalities identified.   Electronically Signed   By: Laveda Abbe M.D.   On: 08/30/2013 17:42   Ct Head Wo Contrast  08/30/2013   CLINICAL DATA:  Altered mental status.  Memory loss.  EXAM: CT HEAD WITHOUT CONTRAST  TECHNIQUE: Contiguous axial images were obtained from the base of the skull through the vertex without intravenous contrast.  COMPARISON:  09/16/2009  FINDINGS: No acute intracranial abnormality. Specifically, no hemorrhage, hydrocephalus, mass lesion, acute infarction, or significant intracranial injury. No acute calvarial abnormality. Visualized paranasal sinuses and mastoids clear. Orbital soft tissues unremarkable.  IMPRESSION: Negative study.   Electronically Signed   By: Charlett Nose M.D.   On: 08/30/2013 13:04    EKG: sinus   Assessment/Plan Active Problems:   Transient global amnesia   TIA (transient ischemic attack)   1. Transient global amnesia/ TIA: - ADMIT to telemetry and TIA work up in progress. - start her on aspirin 325 mg daily.  - MRI brian , MRA head and neck, echocardiogram and carotid duplex ordered.  - PT/OT [place] [facility]  eval   2. 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   3. Hypothyroidism: - tsh ordered  4. Hypertension: - permissive hypertension  Code Status: full code Family Communication: family at bedside Disposition Plan: pending.   Time spent:  [facility] Pager [phone]  If 7PM-7AM, please contact night-coverage [email or web address] Password [facility] 08/30/2013, 6:31 PM

## 2013-08-30 NOTE — ED Provider Notes (Signed)
TIME SEEN: 12:12 PM  CHIEF COMPLAINT: Amnesia  HPI: Patient is a 52 year old female with history of hypertension, hyperlipidemia, hypothyroidism, IgA nephropathy who recently finished a six-month course of steroids approximately one month ago who presents to the emergency department with an episode of amnesia that lasted approximately 10-15 minutes today. Patient reports that she woke up in her normal state of health and went to the grocery store and her grandson. She remembers getting to the grocery store but does not remember being inside the store. She's never had similar symptoms. She states she does have fullness in bilateral ears but no headache. No recent head injury. She has had some intermittent mild, pinching chest pain but no shortness of breath. No numbness, tingling or focal weakness. No prior history of MI or stroke.  ROS: See HPI Constitutional: no fever  Eyes: no drainage  ENT: no runny nose   Cardiovascular:  no chest pain  Resp: no SOB  GI: no vomiting GU: no dysuria Integumentary: no rash  Allergy: no hives  Musculoskeletal: no leg swelling  Neurological: no slurred speech ROS otherwise negative  PAST MEDICAL HISTORY/PAST SURGICAL HISTORY:  Past Medical History  Diagnosis Date  . Hypertension   . Gout   . Thyroid disease   . Hypothyroid   . Chronic kidney disease     proteinuria    MEDICATIONS:  Prior to Admission medications   Medication Sig Start Date End Date Taking? Authorizing Provider  allopurinol (ZYLOPRIM) 300 MG tablet Take 300 mg by mouth daily.    Historical Provider, MD  amLODipine (NORVASC) 5 MG tablet Take 5 mg by mouth daily.    Historical Provider, MD  Cholecalciferol (VITAMIN D3) 2000 UNITS capsule Take 2,000 Units by mouth daily.    Historical Provider, MD  colchicine 0.6 MG tablet Take 0.6 mg by mouth daily as needed (for gout flare up).    Historical Provider, MD  fluticasone (FLONASE) 50 MCG/ACT nasal spray Place 2 sprays into the nose  daily as needed for allergies.    Historical Provider, MD  levothyroxine (SYNTHROID, LEVOTHROID) 150 MCG tablet Take 150 mcg by mouth daily before breakfast.    Historical Provider, MD  loratadine (CLARITIN) 10 MG tablet Take 10 mg by mouth daily as needed for allergies.    Historical Provider, MD  losartan (COZAAR) 100 MG tablet Take 100 mg by mouth daily.    Historical Provider, MD  metoprolol tartrate (LOPRESSOR) 25 MG tablet Take 25 mg by mouth daily.    Historical Provider, MD  Multiple Vitamin (MULTIVITAMIN WITH MINERALS) TABS Take 1 tablet by mouth daily.    Historical Provider, MD    ALLERGIES:  Allergies  Allergen Reactions  . Benicar [Olmesartan]     Coughing   . Erythromycin     Thrush    SOCIAL HISTORY:  History  Substance Use Topics  . Smoking status: Never Smoker   . Smokeless tobacco: Never Used  . Alcohol Use: No    FAMILY HISTORY: Family History  Problem Relation Age of Onset  . Arthritis/Rheumatoid Mother   . Thyroid disease Mother   . Factor V Leiden deficiency Mother   . Hypertension Father    grandmother with history of stroke, grandfather with history of MI, father with history of atrial fibrillation and heart failure who currently has LVAD  EXAM: BP 145/88  Pulse 104  Temp(Src) 98.2 F (36.8 C) (Oral)  Resp 18  Ht 5\' 11"  (1.803 m)  Wt 205 lb 3 oz (93.072  kg)  BMI 28.63 kg/m2  SpO2 96% CONSTITUTIONAL: Alert and oriented x 3 and responds appropriately to questions. Well-appearing; well-nourished HEAD: Normocephalic EYES: Conjunctivae clear, PERRL ENT: normal nose; no rhinorrhea; moist mucous membranes; pharynx without lesions noted NECK: Supple, no meningismus, no LAD  CARD: RRR; S1 and S2 appreciated; no murmurs, no clicks, no rubs, no gallops RESP: Normal chest excursion without splinting or tachypnea; breath sounds clear and equal bilaterally; no wheezes, no rhonchi, no rales,  ABD/GI: Normal bowel sounds; non-distended; soft, non-tender, no  rebound, no guarding BACK:  The back appears normal and is non-tender to palpation, there is no CVA tenderness EXT: Normal ROM in all joints; non-tender to palpation; no edema; normal capillary refill; no cyanosis    SKIN: Normal color for age and race; warm NEURO: Moves all extremities equally; strength 5/5 in all 4 extremities, sensation to light touch intact diffusely, cranial nerves II through XII intact, no dysmetria to finger to nose testing PSYCH: The patient's mood and manner are appropriate. Grooming and personal hygiene are appropriate.  MEDICAL DECISION MAKING: Patient with an episode of transient global amnesia that lasted approximately 10-15 minutes today. She is now back to baseline, neurologically intact. Concern for possible TIA. Will obtain CT head, labs and urine. Have recommended admission.  ED PROGRESS: Patient's labs are reassuring. She does have mildly elevated creatinine at 1.39 but this is her baseline she has a history of IgA nephropathy. Head CT is unremarkable. Will discuss with hospitalist for admission for TIA workup.. Her primary care physician is Dr. Laurann Montana with [facility].  Discussed with hospitalist for admission. Patient updated on plan.   EKG Interpretation    Date/Time:  Friday August 30 2013 14:05:23 EST Ventricular Rate:  92 PR Interval:  163 QRS Duration: 86 QT Interval:  373 QTC Calculation: 461 R Axis:   -14 Text Interpretation:  Sinus rhythm Otherwise normal ECG Confirmed by Juana Haralson  DO, Unique Sillas ([id number]) on 08/30/2013 2:10:28 PM                Layla Maw Kamera Dubas, DO 08/30/13 1423

## 2013-08-30 NOTE — Progress Notes (Signed)
VASCULAR LAB PRELIMINARY  PRELIMINARY  PRELIMINARY  PRELIMINARY  Carotid duplex completed.    Preliminary report: No significant ICA stenosis.  Vertebral artery flow antegrade.  Brandell Maready, RVT 08/30/2013, 5:44 PM

## 2013-08-31 LAB — LIPID PANEL
Cholesterol: 179 mg/dL (ref 0–200)
HDL: 34 mg/dL — ABNORMAL LOW (ref >39)
LDL Cholesterol: 109 mg/dL — ABNORMAL HIGH (ref 0–99)
Total CHOL/HDL Ratio: 5.3 RATIO
Triglycerides: 182 mg/dL — ABNORMAL HIGH (ref <150)

## 2013-08-31 LAB — HEMOGLOBIN A1C
Hgb A1c MFr Bld: 5.6 % (ref <5.7)
Mean Plasma Glucose: 114 mg/dL (ref <117)

## 2013-08-31 MED ORDER — ASCRIPTIN 325 MG PO TABS: 325.0000 mg | ORAL_TABLET | Freq: Every morning | ORAL | Status: DC

## 2013-08-31 NOTE — Discharge Summary (Signed)
Physician Discharge Summary  Stacey Mullins ZOX:[id number] DOB: March 27, 1961 DOA: 08/30/2013  PCP: Cala Bradford, MD  Admit date: 08/30/2013 Discharge date: 08/31/2013  Time spent: 30 minutes  Recommendations for Outpatient Follow-up:  1. No driving until seen by primary care physician or neurologist 2. Please followup with neurology 3. No AED needed as unclear etiology Start aspirin 325 mg daily until seen by neurology  Discharge Diagnoses:  Active Problems:   Transient global amnesia   TIA (transient ischemic attack)   Discharge Condition: stable  Diet recommendation: heart healthy  Filed Weights   08/30/13 1155  Weight: 93.072 kg (205 lb 3 oz)    History of present illness:  This pleasant 52 year old female who is just returned from a mountain trip going 1 mile hi, was in the car at around 11 AM on 11/28 a.m. And will loss track of 10 minutes of time. She actually went into the [employer] box of cereal drove her grandchild home and then was not clear as to where she was what was going on around her. She came to the emergency room where it was thought initially she might have had a TIA with transient global amnesia. She was worked up and had echo carotids and MRI which did not reveal anything occlusive She will need outpatient followup by neurology to determine further whether she needs EEG vs. AED and otherworkup. As a prophylactic because it this is an undifferentiated attack, I will place her on aspirin 325 mg daily to take until seen by neurology Consult her extensively she cannot drive until she sees a neurologist     Discharge Exam: Filed Vitals:   08/31/13 1102  BP: 131/83  Pulse: 88  Temp: 97.9 F (36.6 C)  Resp: 18   Alert pleasant oriented Moving all 4 limbs equally  General: EOMI, NCAT Cardiovascular: S1-S2 no murmur or gallop Respiratory: clinically clear  Discharge Instructions  Discharge Orders   Future Orders Complete By Expires   Diet  - low sodium heart healthy  As directed    Discharge instructions  As directed    Comments:     NO DRIVING WHATSOEVER until seen by Neurology Follow with Neurologist-you might have had a Seizure or a small stroke called a TIA-Start Aspirin 325 daily until seen by by neurology   Increase activity slowly  As directed        Medication List         allopurinol 300 MG tablet  Commonly known as:  ZYLOPRIM  Take 300 mg by mouth daily.     amLODipine 5 MG tablet  Commonly known as:  NORVASC  Take 5 mg by mouth daily.     ASCRIPTIN 325 MG Tabs  Take 325 mg by mouth every morning.     colchicine 0.6 MG tablet  Take 0.6 mg by mouth daily as needed (for gout flare up).     FISH OIL PO  Take 1 capsule by mouth 2 (two) times daily.     fluticasone 50 MCG/ACT nasal spray  Commonly known as:  FLONASE  Place 2 sprays into the nose daily as needed for allergies.     levothyroxine 150 MCG tablet  Commonly known as:  SYNTHROID, LEVOTHROID  Take 150 mcg by mouth daily before breakfast.     metoprolol tartrate 25 MG tablet  Commonly known as:  LOPRESSOR  Take 25 mg by mouth daily.     valsartan 320 MG tablet  Commonly known as:  DIOVAN  Take 320 mg by mouth daily.     Vitamin D3 2000 UNITS capsule  Take 2,000 Units by mouth daily.       Allergies  Allergen Reactions  . Benicar [Olmesartan]     Coughing   . Erythromycin     Thrush      The results of significant diagnostics from this hospitalization (including imaging, microbiology, ancillary and laboratory) are listed below for reference.    Significant Diagnostic Studies: Dg Chest 2 View  08/30/2013   CLINICAL DATA:  52 year old female with recent CVA.  EXAM: CHEST  2 VIEW  COMPARISON:  None.  FINDINGS: The cardiomediastinal silhouette is unremarkable.  Mild elevation of the right hemidiaphragm is noted with mild right basilar atelectasis/ scarring.  There is no evidence of focal airspace disease, pulmonary edema,  suspicious pulmonary nodule/mass, pleural effusion, or pneumothorax.  No acute bony abnormalities are identified.  IMPRESSION: Mild right basilar atelectasis/ scarring with mild elevation of the right hemidiaphragm.  No others significant abnormalities identified.   Electronically Signed   By: Laveda Abbe M.D.   On: 08/30/2013 17:42   Ct Head Wo Contrast  08/30/2013   CLINICAL DATA:  Altered mental status.  Memory loss.  EXAM: CT HEAD WITHOUT CONTRAST  TECHNIQUE: Contiguous axial images were obtained from the base of the skull through the vertex without intravenous contrast.  COMPARISON:  09/16/2009  FINDINGS: No acute intracranial abnormality. Specifically, no hemorrhage, hydrocephalus, mass lesion, acute infarction, or significant intracranial injury. No acute calvarial abnormality. Visualized paranasal sinuses and mastoids clear. Orbital soft tissues unremarkable.  IMPRESSION: Negative study.   Electronically Signed   By: Charlett Nose M.D.   On: 08/30/2013 13:04   Mr Brain Wo Contrast  08/30/2013   CLINICAL DATA:  52 year old female with episode of memory loss. Neck tightness and fullness in the ears. Initial encounter.  EXAM: MRI HEAD WITHOUT CONTRAST  MRA HEAD WITHOUT CONTRAST  TECHNIQUE: Multiplanar, multiecho pulse sequences of the brain and surrounding structures were obtained without intravenous contrast. Angiographic images of the head were obtained using MRA technique without contrast.  COMPARISON:  Head CTs 08/30/2013 and earlier.  FINDINGS: MRI HEAD FINDINGS  Cerebral volume is normal. No restricted diffusion to suggest acute infarction. No midline shift, mass effect, evidence of mass lesion, ventriculomegaly, extra-axial collection or acute intracranial hemorrhage. Cervicomedullary junction and pituitary are within normal limits. Negative visualized cervical spine. Temporal lobe structures appear within normal limits. Major intracranial vascular flow voids are preserved. Scattered small  subcortical white matter T2 and FLAIR hyperintensity, mild for age and in a nonspecific configuration. No cortical encephalomalacia. Deep gray matter nuclei, brainstem and cerebellum within normal limits.  Visualized internal auditory structures appear normal. Mastoids are clear. Negative paranasal sinuses. Visualized orbit soft tissues are within normal limits. Visualized scalp soft tissues are within normal limits. Visualized bone marrow signal is within normal limits.  MRA HEAD FINDINGS  Antegrade flow in the posterior circulation with codominant distal vertebral arteries. Normal PICA origins. Mildly tortuous but otherwise negative vertebrobasilar junction and proximal basilar artery. No basilar stenosis. SCA and PCA origins are within normal limits. Posterior communicating arteries are diminutive or absent. Bilateral PCA branches are normal.  Antegrade flow in both ICA siphons. No ICA stenosis. Normal ophthalmic artery origins. Normal carotid termini, MCA and ACA origins. Diminutive or absent anterior communicating artery. Visualized bilateral ACA and MCA branches are within normal limits.  IMPRESSION: 1. No acute intracranial abnormality. Mild for age nonspecific cerebral white matter signal changes.  2.  Negative intracranial MRA.   Electronically Signed   By: Augusto Gamble M.D.   On: 08/30/2013 19:13   Mr Maxine Glenn Head/brain Wo Cm  08/30/2013   CLINICAL DATA:  52 year old female with episode of memory loss. Neck tightness and fullness in the ears. Initial encounter.  EXAM: MRI HEAD WITHOUT CONTRAST  MRA HEAD WITHOUT CONTRAST  TECHNIQUE: Multiplanar, multiecho pulse sequences of the brain and surrounding structures were obtained without intravenous contrast. Angiographic images of the head were obtained using MRA technique without contrast.  COMPARISON:  Head CTs 08/30/2013 and earlier.  FINDINGS: MRI HEAD FINDINGS  Cerebral volume is normal. No restricted diffusion to suggest acute infarction. No midline shift,  mass effect, evidence of mass lesion, ventriculomegaly, extra-axial collection or acute intracranial hemorrhage. Cervicomedullary junction and pituitary are within normal limits. Negative visualized cervical spine. Temporal lobe structures appear within normal limits. Major intracranial vascular flow voids are preserved. Scattered small subcortical white matter T2 and FLAIR hyperintensity, mild for age and in a nonspecific configuration. No cortical encephalomalacia. Deep gray matter nuclei, brainstem and cerebellum within normal limits.  Visualized internal auditory structures appear normal. Mastoids are clear. Negative paranasal sinuses. Visualized orbit soft tissues are within normal limits. Visualized scalp soft tissues are within normal limits. Visualized bone marrow signal is within normal limits.  MRA HEAD FINDINGS  Antegrade flow in the posterior circulation with codominant distal vertebral arteries. Normal PICA origins. Mildly tortuous but otherwise negative vertebrobasilar junction and proximal basilar artery. No basilar stenosis. SCA and PCA origins are within normal limits. Posterior communicating arteries are diminutive or absent. Bilateral PCA branches are normal.  Antegrade flow in both ICA siphons. No ICA stenosis. Normal ophthalmic artery origins. Normal carotid termini, MCA and ACA origins. Diminutive or absent anterior communicating artery. Visualized bilateral ACA and MCA branches are within normal limits.  IMPRESSION: 1. No acute intracranial abnormality. Mild for age nonspecific cerebral white matter signal changes. 2.  Negative intracranial MRA.   Electronically Signed   By: Augusto Gamble M.D.   On: 08/30/2013 19:13    Microbiology: No results found for this or any previous visit (from the past 240 hour(s)).   Labs: Basic Metabolic Panel:  Recent Labs Lab 08/30/13 1315  NA 142  K 3.7  CL 106  CO2 25  GLUCOSE 94  BUN 30*  CREATININE 1.39*  CALCIUM 8.6   Liver Function  Tests:  Recent Labs Lab 08/30/13 1315  AST 21  ALT 14  ALKPHOS 81  BILITOT 0.3  PROT 6.3  ALBUMIN 3.2*   No results found for this basename: LIPASE, AMYLASE,  in the last 168 hours No results found for this basename: AMMONIA,  in the last 168 hours CBC:  Recent Labs Lab 08/30/13 1315  WBC 8.9  NEUTROABS 6.8  HGB 12.6  HCT 35.8*  MCV 91.8  PLT 181   Cardiac Enzymes:  Recent Labs Lab 08/30/13 1316  TROPONINI <0.30   BNP: BNP (last 3 results) No results found for this basename: PROBNP,  in the last 8760 hours CBG: No results found for this basename: GLUCAP,  in the last 168 hours     Signed:  Rhetta Mura  [facility] 08/31/2013, 12:07 PM    and

## 2013-08-31 NOTE — Progress Notes (Signed)
OT Cancellation Note  Patient Details Name: Stacey Mullins MRN: [id number] DOB: 1961-04-23   Cancelled Treatment:    Reason Eval/Treat Not Completed: OT screened, no needs identified, will sign off. Pt reports she has been up and ambulating around room.  Feels that she is still at her baseline and denies any further amnesic events since admission.  Will sign off at this time.   08/31/2013 Cipriano Mile OTR/L Pager [phone] Office [phone]

## 2013-08-31 NOTE — Progress Notes (Signed)
Utilization Review Completed.   Arrion Broaddus Kowalchuk, RN, BSN Nurse Case Manager  

## 2013-09-09 ENCOUNTER — Encounter: Payer: Self-pay | Admitting: Neurology

## 2013-09-09 ENCOUNTER — Ambulatory Visit (INDEPENDENT_AMBULATORY_CARE_PROVIDER_SITE_OTHER): Payer: Commercial Managed Care - PPO | Admitting: Neurology

## 2013-09-09 VITALS — BP 128/84 | HR 88 | Temp 97.8°F | Resp 14 | Ht 70.0 in | Wt 204.6 lb

## 2013-09-09 DIAGNOSIS — R404 Transient alteration of awareness: Secondary | ICD-10-CM

## 2013-09-09 NOTE — Patient Instructions (Addendum)
1.  No driving.   2.  Your sleep deprived EEG is scheduled for Thursday December 11th at 7:45am [facility] entrance A [phone] and I want you to call me one week after completed for the results

## 2013-09-09 NOTE — Progress Notes (Signed)
Stacey Mullins was seen today in neurologic consultation at the request of Cala Bradford, MD.  The consultation is for the evaluation of a spell that occurred at the end of November 2014.  This patient is accompanied in the office by her spouse who supplements the history.The patient reports that she went to the store with her grandson and remembers going to the store, but does not remember being in the store.  She doesn't remember being in the store but the 74 year old grandson reports that everything was fine.  She talked with the husband on the phone and she sounded fine.  She went to pick up the husband a few minutes later and asks where she had been, and her husband reports that she asked where her grandson was (was in backseat).  Her husband asked her questions of orientation and while she was oriented to person/place/time, she had trouble identifying when she got married and she was reporting when she first got married to her first husband (not current husband).  She was upset and started crying.  She told her husband to call the pastor and wanted to go to the hospital.   She asked her husband 3 times if he had called the pastor, even though she spoke with the pastor on the phone.  The symptoms lasted approximately 15 min.  She ended up going to the hospital and still felt a bit "fuzzy."  She had a further evaluation that I had the opportunity to review.  On 08/30/2013 she echocardiogram demonstrating a left ventricular ejection fraction of 55-60%.  On 08/30/2013 she had a carotid ultrasound demonstrating 1-39% stenosis bilaterally.  On 08/30/2013 she had an MRI of the brain and MRA of the brain I had the opportunity to review.  The MRA was normal.  The MRI demonstrated a few, rare T2 hyperintensities.  A coronal view was not obtained on the MRI.  Diffusion weighted imaging was negative.  She had never had anything similar.  She is not a migraneur but she does report that she had a pinching sensation and  pressure over the base of the skull.  She has been tired and sleepy all week but states that she has been running "wide open" and has been very busy with home activities.  She was being weaned off of prednisone and had been on it for 6 months for kidney disease.  While on the prednisone, she had trouble sleeping so thinks that she might be "catching up" on sleep since she had insomnia after that.  She does state that she had started on valsartan just a few weeks before and it has since been changed to losartan.  She reports no hx of similar.  She met her developmental milestones at appropriate ages.  No hx of seizure.  She is not driving.  She was RX ASA on hosp d/c but did not take it because of her kidney problems.  She has a call in to her nephrologist.   PREVIOUS MEDICATIONS: n/a  ALLERGIES:   Allergies  Allergen Reactions  . Benicar [Olmesartan]     Coughing   . Erythromycin     Thrush    CURRENT MEDICATIONS:  Current Outpatient Prescriptions on File Prior to Visit  Medication Sig Dispense Refill  . allopurinol (ZYLOPRIM) 300 MG tablet Take 300 mg by mouth daily.      Marland Kitchen amLODipine (NORVASC) 5 MG tablet Take 5 mg by mouth daily.      . Aspirin  Buf,AlHyd-MgHyd-CaCar, (ASCRIPTIN) 325 MG TABS Take 325 mg by mouth every morning.  360 each  0  . Cholecalciferol (VITAMIN D3) 2000 UNITS capsule Take 2,000 Units by mouth daily.      . colchicine 0.6 MG tablet Take 0.6 mg by mouth daily as needed (for gout flare up).      . fluticasone (FLONASE) 50 MCG/ACT nasal spray Place 2 sprays into the nose daily as needed for allergies.      Marland Kitchen levothyroxine (SYNTHROID, LEVOTHROID) 150 MCG tablet Take 150 mcg by mouth daily before breakfast.      . metoprolol tartrate (LOPRESSOR) 25 MG tablet Take 25 mg by mouth daily.      . Omega-3 Fatty Acids (FISH OIL PO) Take 1 capsule by mouth 2 (two) times daily.      . valsartan (DIOVAN) 320 MG tablet Take 320 mg by mouth daily.       No current  facility-administered medications on file prior to visit.    PAST MEDICAL HISTORY:   Past Medical History  Diagnosis Date  . Hypertension   . Gout   . Thyroid disease   . Hypothyroid   . Chronic kidney disease     proteinuria    PAST SURGICAL HISTORY:   Past Surgical History  Procedure Laterality Date  . Renal biopsy      NGA nephropathy    SOCIAL HISTORY:   History   Social History  . Marital Status: Married    Spouse Name: N/A    Number of Children: N/A  . Years of Education: N/A   Occupational History  .      insurance company   Social History Main Topics  . Smoking status: Never Smoker   . Smokeless tobacco: Never Used  . Alcohol Use: No  . Drug Use: No  . Sexual Activity: Yes    Birth Control/ Protection: Post-menopausal   Other Topics Concern  . Not on file   Social History Narrative  . No narrative on file    FAMILY HISTORY:   Family Status  Relation Status Death Age  . Mother Alive     RA, HTN, hypothyroidism  . Father Alive     CAD, degenerative arthritis, A-fib  . Brother Alive     2, hypothyroidism  . Sister Alive     2, hypothyroidism  . Child Alive     healthy    ROS:  A complete 10 system review of systems was obtained and was unremarkable apart from what is mentioned above.  PHYSICAL EXAMINATION:    VITALS:   Filed Vitals:   09/09/13 0806  BP: 128/84  Pulse: 88  Temp: 97.8 F (36.6 C)  Resp: 14  Height: 5\' 10"  (1.778 m)  Weight: 204 lb 9.6 oz (92.806 kg)    GEN:  Normal appears female in no acute distress.  Appears stated age. HEENT:  Normocephalic, atraumatic. The mucous membranes are moist. The superficial temporal arteries are without ropiness or tenderness. Cardiovascular: Regular rate and rhythm. Lungs: Clear to auscultation bilaterally. Neck/Heme: There are no carotid bruits noted bilaterally.  NEUROLOGICAL: Orientation:  The patient is alert and oriented x 3.  Fund of knowledge is appropriate.  Recent and  remote memory intact.  Attention span and concentration normal.  Repeats and names without difficulty. Cranial nerves: There is good facial symmetry. The pupils are equal round and reactive to light bilaterally. Fundoscopic exam reveals clear disc margins bilaterally. Extraocular muscles are intact and visual fields are  full to confrontational testing. Speech is fluent and clear. Soft palate rises symmetrically and there is no tongue deviation. Hearing is intact to conversational tone. Tone: Tone is good throughout. Sensation: Sensation is intact to light touch and pinprick throughout (facial, trunk, extremities). Vibration is intact at the bilateral big toe. There is no extinction with double simultaneous stimulation. There is no sensory dermatomal level identified. Coordination:  The patient has no difficulty with RAM's or FNF bilaterally. Motor: Strength is 5/5 in the bilateral upper and lower extremities.  Shoulder shrug is equal and symmetric. There is no pronator drift.  There are no fasciculations noted. DTR's: Deep tendon reflexes are 2/4 at the bilateral biceps, triceps, brachioradialis, patella and achilles.  Plantar responses are downgoing bilaterally. Gait and Station: The patient is able to ambulate without difficulty. The patient is able to heel toe walk without any difficulty. The patient is able to ambulate in a tandem fashion. The patient is able to stand in the Romberg position.   IMPRESSION/PLAN  1. Transient alteration in awareness.  -I. think that seizure and TGA are highest on the list of Ddx.  I do not think that she had a TIA. I talked with the patient and her husband about the vast tx differences between seizure and TGA.  It is important for [place] to try and differentiate between the two.  -She will have a sleep deprived EEG.  If neg, she will have an ambulatory EEG.  -She will follow seizure and safety, including no driving, until testing is complete.  -She will talk with her  nephrologist about whether or not ASA, 81 mg, would be harmful.   -She will call or email me after her routine EEG is complete.  -I answered all their questions to the best of my ability.

## 2013-09-12 ENCOUNTER — Other Ambulatory Visit (HOSPITAL_COMMUNITY): Payer: Commercial Managed Care - PPO

## 2013-09-12 ENCOUNTER — Ambulatory Visit (HOSPITAL_COMMUNITY)
Admission: RE | Admit: 2013-09-12 | Discharge: 2013-09-12 | Disposition: A | Discharge status: Home / Self Care | Payer: Commercial Managed Care - PPO | Source: Ambulatory Visit | Attending: Neurology | Admitting: Neurology

## 2013-09-12 DIAGNOSIS — R404 Transient alteration of awareness: Secondary | ICD-10-CM

## 2013-09-12 NOTE — Progress Notes (Signed)
EEG Completed; Results Pending  

## 2013-09-12 NOTE — Procedures (Signed)
TECHNICAL SUMMARY:  A 24 channel referential and bipolar montage EEG using the standard international 10-20 system was performed on the patient described as awake and asleep.  The dominant background activity consists of 9-10 hertz activity seen most prominantly over the anterior head region.  The backgound activity is reactive to eye opening and closing procedures.  Low voltage fast (beta) activity is distributed symmetrically and maximally over the anterior head regions.  ACTIVATION:  Stepwise photic stimulation at 4-20 flashes per second was performed and did not elicit any abnormal waveforms.  Hyperventilation was performed for 3 minutes with good patient effort and produced slowing, consisten with a normal HV response.  EPILEPTIFORM ACTIVITY:  There were no spikes, sharp waves or paroxysmal activity.  SLEEP:  Both stage 1 and 2 sleep were noted.  CARDIAC:  The EKG lead revealed a normal sinus rhythm.  IMPRESSION:  This is a normal EEG for the patients stated age.  There were no focal, hemispheric or lateralizing features.  No epileptiform activity was recorded.

## 2013-09-17 ENCOUNTER — Telehealth: Payer: Self-pay

## 2013-09-17 NOTE — Telephone Encounter (Signed)
Pt called requesting EEG results.  °

## 2013-09-18 NOTE — Telephone Encounter (Signed)
When do you want her to have the Amb EEG?

## 2013-09-18 NOTE — Telephone Encounter (Signed)
Called pt and relayed your message. She wants to know if she can drive now?

## 2013-09-18 NOTE — Telephone Encounter (Signed)
It was normal

## 2013-09-18 NOTE — Telephone Encounter (Signed)
I do not recommend until ambulatory EEG and she sees Dr. Karel Jarvis when she joins [place].  The short EEG just tells [place] that she was good during the 20 mins it was hooked up.

## 2013-09-18 NOTE — Telephone Encounter (Signed)
Prefer feb since the epileptologist can read it and she is the expert.

## 2013-09-19 NOTE — Telephone Encounter (Signed)
Sherri, please put on your schedule for feb as a reminder for [place] to schedule the ambulatory when Karel Jarvis is here.

## 2013-09-19 NOTE — Telephone Encounter (Signed)
Called pt and relayed your message. 

## 2013-09-29 NOTE — Telephone Encounter (Signed)
Sherri, I want to close this message out so want to make sure she is on [place] schedule?

## 2013-09-30 ENCOUNTER — Telehealth: Payer: Self-pay

## 2013-09-30 DIAGNOSIS — R569 Unspecified convulsions: Secondary | ICD-10-CM

## 2013-09-30 NOTE — Telephone Encounter (Signed)
Pt called very anxious to be released to drive. She wants to know how that can happen because she just can't wait until February. Can she go ahead and get the Amb EEG now?

## 2013-09-30 NOTE — Telephone Encounter (Signed)
Just send her to [facility] and see if they can do faster.  We have no one to read those ambulatory EEG's until Dr. Karel Jarvis here.  She will need referral to epilepsy there Meryle Ready okay) and he can decide whether or not she can drive and/or if she needs ambulatory.

## 2013-10-01 NOTE — Telephone Encounter (Signed)
Called pt and relayed your message. Sent referral to [facility].

## 2013-10-01 NOTE — Telephone Encounter (Signed)
I called pt and relayed your message. She said that you had told her after the routine EEG you could discuss over the phone whether or not she could drive. She is wondering why she needs to go to Dr.Wong to do that?

## 2013-10-01 NOTE — Telephone Encounter (Signed)
Yes

## 2013-10-14 ENCOUNTER — Other Ambulatory Visit: Payer: Self-pay

## 2013-10-14 DIAGNOSIS — R569 Unspecified convulsions: Secondary | ICD-10-CM

## 2013-10-15 ENCOUNTER — Telehealth: Payer: Self-pay

## 2013-10-15 NOTE — Telephone Encounter (Signed)
Called pt and let her know that her Amb EEG is scheduled for Tuesday January 27th at 8:45am [facility].

## 2013-10-28 ENCOUNTER — Telehealth: Payer: Self-pay | Admitting: Neurology

## 2013-10-28 NOTE — Telephone Encounter (Signed)
I confirmed with the patient that she is having Ambulatory EEG tomorrow. This is a 24 hour test. Patient will call with any other questions.

## 2013-10-28 NOTE — Telephone Encounter (Signed)
Patient would like to confirm the procedure tomorrow as far as the protocol. She is having the EET.

## 2013-10-29 ENCOUNTER — Other Ambulatory Visit (HOSPITAL_COMMUNITY): Payer: Commercial Managed Care - PPO

## 2013-10-31 ENCOUNTER — Telehealth: Payer: Self-pay | Admitting: Neurology

## 2013-10-31 NOTE — Telephone Encounter (Signed)
Appt made with patient.  

## 2013-10-31 NOTE — Telephone Encounter (Signed)
Spoke with patient. She cancelled ambulatory EEG this week due to the flu and now [facility]. She wants to know if her driving restriction is staying in place until she has this done? Please advise.

## 2013-10-31 NOTE — Telephone Encounter (Signed)
Have her make appt with Clydie BraunKaren next week to discuss

## 2013-11-05 ENCOUNTER — Ambulatory Visit (INDEPENDENT_AMBULATORY_CARE_PROVIDER_SITE_OTHER): Payer: Commercial Managed Care - PPO | Admitting: Neurology

## 2013-11-05 ENCOUNTER — Other Ambulatory Visit (HOSPITAL_COMMUNITY): Payer: Commercial Managed Care - PPO

## 2013-11-05 ENCOUNTER — Encounter: Payer: Self-pay | Admitting: Neurology

## 2013-11-05 VITALS — BP 130/78 | HR 68 | Temp 97.0°F | Resp 16 | Ht 71.0 in | Wt 202.0 lb

## 2013-11-05 DIAGNOSIS — G454 Transient global amnesia: Secondary | ICD-10-CM

## 2013-11-05 DIAGNOSIS — R404 Transient alteration of awareness: Secondary | ICD-10-CM

## 2013-11-05 NOTE — Progress Notes (Signed)
NEUROLOGY FOLLOW UP OFFICE NOTE  Stacey Mullins [id number]  HISTORY OF PRESENT ILLNESS: I had the pleasure of seeing Stacey Mullins in follow-up in the neurology clinic on 11/05/13.  She was seen previously by one of my partners, Dr. Arbutus Leas for an episode of amnesia that occurred on 08/30/13.  She is accompanied by her husband today.  We reviewed the history, she recalls walking into the store but does not remember being in the store. She doesn't remember being in the store but the 51 year old grandson reports that everything was fine. She talked with the husband on the phone and she sounded fine. She went to pick up the husband a few minutes later and asks where she had been, and her husband reports that she asked where her grandson was (was in backseat). Her husband asked her questions of orientation and while she was oriented to person/place/time, she had trouble identifying when she got married and would answer dates when she first got married to her first husband (not current husband). She was upset and started crying. She told her husband to call the pastor and wanted to go to the hospital. She asked her husband 3 times if he had called the pastor, even though she spoke with the pastor on the phone. The symptoms lasted approximately 15 min. She ended up going to the hospital and still felt a bit "fuzzy." Evaluation with echocardiogram, carotid ultrasound, MRA of the brain were unremarkable.  The MRI demonstrated a few, rare T2 hyperintensities, no acute changes.  She is not a migraneur but she does report that she had a pinching sensation and pressure over the base of the skull. She had been tired and sleepy all week but states that she has been running "wide open" and has been very busy with home activities. She was being weaned off of prednisone and had been on it for 6 months for kidney disease. She reports no hx of similar.    In the interim, she had a routine EEG which was normal.  She had been  scheduled for a 24-hour EEG, however due to technical difficulties, this has been on hold.  She denies any further similar symptoms since 08/30/13.  She and her husband deny any episodes of staring/unresponsiveness, gaps in time, olfactory/gustatory hallucinations, rising epigastric sensation, focal numbess/tingling/weakness, myoclonic jerks.    She denies any headaches, dizziness, diplopia, dysarthria, dysphagia, neck/back pain, bowel/bladder dysfunction.  She had a normal birth and early development, no history of febrile convulsions, CNS infections, significant traumatic brain injury, family history of seizures, or neurosurgical procedures.  Records and images were personally reviewed where available.   PAST MEDICAL HISTORY: Past Medical History  Diagnosis Date  . Hypertension   . Gout   . Thyroid disease   . Hypothyroid   . Chronic kidney disease     proteinuria    MEDICATIONS: Current Outpatient Prescriptions on File Prior to Visit  Medication Sig Dispense Refill  . allopurinol (ZYLOPRIM) 300 MG tablet Take 300 mg by mouth daily.      Marland Kitchen amLODipine (NORVASC) 5 MG tablet Take 5 mg by mouth daily.      . Cholecalciferol (VITAMIN D3) 2000 UNITS capsule Take 2,000 Units by mouth daily.      . colchicine 0.6 MG tablet Take 0.6 mg by mouth daily as needed (for gout flare up).      . fluticasone (FLONASE) 50 MCG/ACT nasal spray Place 2 sprays into the nose daily as needed for  allergies.      Marland Kitchen. levothyroxine (SYNTHROID, LEVOTHROID) 150 MCG tablet Take 150 mcg by mouth daily before breakfast.      . metoprolol tartrate (LOPRESSOR) 25 MG tablet Take 25 mg by mouth daily.      . Omega-3 Fatty Acids (FISH OIL PO) Take 1 capsule by mouth 2 (two) times daily.      . Aspirin Buf,AlHyd-MgHyd-CaCar, (ASCRIPTIN) 325 MG TABS Take 325 mg by mouth every morning.  360 each  0   No current facility-administered medications on file prior to visit.    ALLERGIES: Allergies  Allergen Reactions  .  Benicar [Olmesartan]     Coughing   . Erythromycin     Thrush    FAMILY HISTORY: Family History  Problem Relation Age of Onset  . Arthritis/Rheumatoid Mother   . Thyroid disease Mother   . Factor V Leiden deficiency Mother   . Hypertension Father     SOCIAL HISTORY: History   Social History  . Marital Status: Married    Spouse Name: N/A    Number of Children: N/A  . Years of Education: N/A   Occupational History  .      insurance company   Social History Main Topics  . Smoking status: Never Smoker   . Smokeless tobacco: Never Used  . Alcohol Use: No  . Drug Use: No  . Sexual Activity: Yes    Birth Control/ Protection: Post-menopausal   Other Topics Concern  . Not on file   Social History Narrative  . No narrative on file    REVIEW OF SYSTEMS: Constitutional: No fevers, chills, or sweats, no generalized fatigue, change in appetite Eyes: No visual changes, double vision, eye pain Ear, nose and throat: No hearing loss, ear pain, nasal congestion, sore throat Cardiovascular: No chest pain, palpitations Respiratory:  No shortness of breath at rest or with exertion, wheezes GastrointestinaI: No nausea, vomiting, diarrhea, abdominal pain, fecal incontinence Genitourinary:  No dysuria, urinary retention or frequency Musculoskeletal:  No neck pain, back pain Integumentary: No rash, pruritus, skin lesions Neurological: as above Psychiatric: No depression, insomnia, anxiety Endocrine: No palpitations, fatigue, diaphoresis, mood swings, change in appetite, change in weight, increased thirst Hematologic/Lymphatic:  No anemia, purpura, petechiae. Allergic/Immunologic: no itchy/runny eyes, nasal congestion, recent allergic reactions, rashes  PHYSICAL EXAM: Filed Vitals:   11/05/13 1347  BP: 130/78  Pulse: 68  Temp: 97 F (36.1 C)  Resp: 16   General: No acute distress Head:  Normocephalic/atraumatic Neck: supple, no paraspinal tenderness, full range of  motion Heart:  Regular rate and rhythm Lungs:  Clear to auscultation bilaterally Back: No paraspinal tenderness Neurological Exam: alert and oriented to person, place, and time. No aphasia/dysarthria, intact fluency and comprehension.  CN II-XII intact. Motor: Bulk and tone normal, muscle strength 5/5 throughout.  Sensation to light touch, temperature, pin intact.  Deep tendon reflexes 2+ throughout, toes downgoing.  Finger to nose testing intact.  Gait narrow-based and steady, able to tandem walk adequately.  IMPRESSION: This is a pleasant 53 year old right-handed woman with a history of hypertension and IgA nephropathy who had an episode of transient amnesia last 08/30/13.  Witnesses reported she was not confused during the time she cannot recall, however afterward she asked the same questions repeatedly.  Her neurological exam is normal.  MRI/MRA brain, carotid dopplers, echocardiogram unremarkable.  We discussed differentials, by history the episode is highly suggestive of transient global amnesia.  Other considerations include seizure, less likely TIA.  She  has been scheduled for a 24-hour EEG, which I would recommend to further help with classification.  She will be speaking with her nephrologist about starting low dose daily aspirin 81mg .  We had an extensive discussion about the diagnosis, prognosis, and management.  [place] driving laws were discussed at length, recommendation is not to drive until 6 months event-free.  She and her husband expressed understanding and will follow-up in 3 months. Thank you for allowing me to participate in her care.  The duration of this appointment visit was 45 minutes of face-to-face time with the patient.  Greater than 50% of this time was spent in counseling, explanation of diagnosis, planning of further management, and coordination of care.    Patrcia Dolly, M.D.

## 2013-11-05 NOTE — Patient Instructions (Signed)
1. Schedule 24-hour EEG when machine available 2. No driving until event-free for 6 months 3. Follow-up in 3 months.

## 2013-11-20 ENCOUNTER — Other Ambulatory Visit (HOSPITAL_COMMUNITY): Payer: Commercial Managed Care - PPO

## 2013-12-03 ENCOUNTER — Ambulatory Visit (HOSPITAL_COMMUNITY)
Admission: RE | Admit: 2013-12-03 | Discharge: 2013-12-03 | Disposition: A | Discharge status: Home / Self Care | Payer: Commercial Managed Care - PPO | Source: Ambulatory Visit | Attending: Neurology | Admitting: Neurology

## 2013-12-03 DIAGNOSIS — R404 Transient alteration of awareness: Secondary | ICD-10-CM

## 2013-12-03 DIAGNOSIS — R569 Unspecified convulsions: Secondary | ICD-10-CM

## 2013-12-04 NOTE — Progress Notes (Signed)
AEEG completed and removed. Prelim report being sent to Dr Tat/Dr Karel JarvisAquino along with a faxed copy of the pts journal.

## 2013-12-06 ENCOUNTER — Telehealth: Payer: Self-pay | Admitting: Neurology

## 2013-12-06 NOTE — Procedures (Signed)
ELECTROENCEPHALOGRAM REPORT  Dates of Recording: 12/03/2013 to 12/04/2013  Patient's Name: Stacey FeinsteinJulie A Bove MRN: [id number] Date of Birth: July 31, 1961  Referring Provider: Dr. Patrcia DollyKaren Aquino  Procedure: 24-hour ambulatory EEG  History: 53 year old right-handed woman with a history of hypertension and IgA nephropathy who had an episode of transient amnesia last 08/30/13.   Medications: Norvasc, aspirin, Synthroid, Cozaar, Lopressor  Technical Summary: This is a 24-hour multichannel digital EEG recording measured by the international 10-20 system with electrodes applied with paste and impedances below 5000 ohms performed as portable with EKG monitoring.  The digital EEG was referentially recorded, reformatted, and digitally filtered in a variety of bipolar and referential montages for optimal display.    DESCRIPTION OF RECORDING: During maximal wakefulness, the background activity consisted of a symmetric 10.5-11 Hz posterior dominant rhythm which was reactive to eye opening.  The record is symmetric.  There were no epileptiform discharges or focal slowing seen in wakefulness.  During the recording, the patient progresses through wakefulness, drowsiness, and Stage 2 sleep.  Again, there were no epileptiform discharges seen.  Events: There were no pushbutton events. No symptoms reported.  There were no electrographic seizures seen.  EKG lead was unremarkable.  IMPRESSION: This 24-hour ambulatory EEG study is normal.    CLINICAL CORRELATION: A normal EEG does not exclude a clinical diagnosis of epilepsy. Typical events were not captured. If further clinical questions remain, inpatient video EEG monitoring may be helpful.   Patrcia DollyKaren Aquino, M.D.

## 2013-12-06 NOTE — Telephone Encounter (Signed)
Discussed normal 24-hour EEG.  Patient feeling well, no further similar symptoms.  She has no questions at this time.

## 2014-05-06 ENCOUNTER — Other Ambulatory Visit (HOSPITAL_COMMUNITY): Payer: Self-pay | Admitting: Family Medicine

## 2014-05-06 DIAGNOSIS — Z1231 Encounter for screening mammogram for malignant neoplasm of breast: Secondary | ICD-10-CM

## 2014-05-07 ENCOUNTER — Ambulatory Visit (HOSPITAL_COMMUNITY): Payer: Commercial Managed Care - PPO

## 2014-05-07 ENCOUNTER — Ambulatory Visit (HOSPITAL_COMMUNITY)
Admission: RE | Admit: 2014-05-07 | Discharge: 2014-05-07 | Disposition: A | Discharge status: Home / Self Care | Payer: Commercial Managed Care - PPO | Source: Ambulatory Visit | Attending: Family Medicine | Admitting: Family Medicine

## 2014-05-07 DIAGNOSIS — Z1231 Encounter for screening mammogram for malignant neoplasm of breast: Secondary | ICD-10-CM | POA: Diagnosis present

## 2014-06-07 ENCOUNTER — Encounter: Payer: Self-pay | Admitting: *Deleted

## 2014-07-18 ENCOUNTER — Other Ambulatory Visit: Payer: Self-pay

## 2015-03-10 ENCOUNTER — Other Ambulatory Visit (HOSPITAL_COMMUNITY): Payer: Self-pay | Admitting: Family Medicine

## 2015-03-10 DIAGNOSIS — Z1231 Encounter for screening mammogram for malignant neoplasm of breast: Secondary | ICD-10-CM

## 2015-03-11 ENCOUNTER — Other Ambulatory Visit (HOSPITAL_COMMUNITY)
Admission: RE | Admit: 2015-03-11 | Discharge: 2015-03-11 | Disposition: A | Discharge status: Home / Self Care | Payer: Commercial Managed Care - PPO | Source: Ambulatory Visit | Attending: Family Medicine | Admitting: Family Medicine

## 2015-03-11 ENCOUNTER — Other Ambulatory Visit: Payer: Self-pay | Admitting: Family Medicine

## 2015-03-11 DIAGNOSIS — Z01419 Encounter for gynecological examination (general) (routine) without abnormal findings: Secondary | ICD-10-CM | POA: Diagnosis present

## 2015-03-13 LAB — CYTOLOGY - PAP

## 2015-05-11 ENCOUNTER — Ambulatory Visit (HOSPITAL_COMMUNITY)
Admission: RE | Admit: 2015-05-11 | Discharge: 2015-05-11 | Disposition: A | Discharge status: Home / Self Care | Payer: Commercial Managed Care - PPO | Source: Ambulatory Visit | Attending: Family Medicine | Admitting: Family Medicine

## 2015-05-11 DIAGNOSIS — Z1231 Encounter for screening mammogram for malignant neoplasm of breast: Secondary | ICD-10-CM | POA: Insufficient documentation

## 2016-04-15 ENCOUNTER — Other Ambulatory Visit: Payer: Self-pay | Admitting: Family Medicine

## 2016-04-15 DIAGNOSIS — Z1231 Encounter for screening mammogram for malignant neoplasm of breast: Secondary | ICD-10-CM

## 2016-05-19 ENCOUNTER — Ambulatory Visit
Admission: RE | Admit: 2016-05-19 | Discharge: 2016-05-19 | Disposition: A | Discharge status: Home / Self Care | Payer: Commercial Managed Care - PPO | Source: Ambulatory Visit | Attending: Family Medicine | Admitting: Family Medicine

## 2016-05-19 DIAGNOSIS — Z1231 Encounter for screening mammogram for malignant neoplasm of breast: Secondary | ICD-10-CM

## 2016-11-30 DIAGNOSIS — N183 Chronic kidney disease, stage 3 (moderate): Secondary | ICD-10-CM | POA: Diagnosis not present

## 2016-11-30 DIAGNOSIS — I10 Essential (primary) hypertension: Secondary | ICD-10-CM | POA: Diagnosis not present

## 2016-11-30 DIAGNOSIS — E039 Hypothyroidism, unspecified: Secondary | ICD-10-CM | POA: Diagnosis not present

## 2016-11-30 DIAGNOSIS — N028 Recurrent and persistent hematuria with other morphologic changes: Secondary | ICD-10-CM | POA: Diagnosis not present

## 2017-02-28 DIAGNOSIS — N183 Chronic kidney disease, stage 3 (moderate): Secondary | ICD-10-CM | POA: Diagnosis not present

## 2017-03-18 DIAGNOSIS — M545 Low back pain: Secondary | ICD-10-CM | POA: Diagnosis not present

## 2017-03-18 DIAGNOSIS — A932 Colorado tick fever: Secondary | ICD-10-CM | POA: Diagnosis not present

## 2017-04-10 ENCOUNTER — Other Ambulatory Visit: Payer: Self-pay | Admitting: Family Medicine

## 2017-04-10 DIAGNOSIS — Z1231 Encounter for screening mammogram for malignant neoplasm of breast: Secondary | ICD-10-CM

## 2017-05-10 DIAGNOSIS — M545 Low back pain: Secondary | ICD-10-CM | POA: Diagnosis not present

## 2017-05-22 ENCOUNTER — Ambulatory Visit
Admission: RE | Admit: 2017-05-22 | Discharge: 2017-05-22 | Disposition: A | Discharge status: Home / Self Care | Payer: Commercial Managed Care - PPO | Source: Ambulatory Visit | Attending: Family Medicine | Admitting: Family Medicine

## 2017-05-22 DIAGNOSIS — Z1231 Encounter for screening mammogram for malignant neoplasm of breast: Secondary | ICD-10-CM

## 2017-06-12 ENCOUNTER — Other Ambulatory Visit: Payer: Self-pay | Admitting: Family Medicine

## 2017-06-12 ENCOUNTER — Other Ambulatory Visit (HOSPITAL_COMMUNITY)
Admission: RE | Admit: 2017-06-12 | Discharge: 2017-06-12 | Disposition: A | Discharge status: Home / Self Care | Payer: Commercial Managed Care - PPO | Source: Ambulatory Visit | Attending: Family Medicine | Admitting: Family Medicine

## 2017-06-12 DIAGNOSIS — Z0000 Encounter for general adult medical examination without abnormal findings: Secondary | ICD-10-CM | POA: Diagnosis not present

## 2017-06-12 DIAGNOSIS — E039 Hypothyroidism, unspecified: Secondary | ICD-10-CM | POA: Diagnosis not present

## 2017-06-12 DIAGNOSIS — Z124 Encounter for screening for malignant neoplasm of cervix: Secondary | ICD-10-CM | POA: Insufficient documentation

## 2017-06-12 DIAGNOSIS — M109 Gout, unspecified: Secondary | ICD-10-CM | POA: Diagnosis not present

## 2017-06-12 DIAGNOSIS — I129 Hypertensive chronic kidney disease with stage 1 through stage 4 chronic kidney disease, or unspecified chronic kidney disease: Secondary | ICD-10-CM | POA: Diagnosis not present

## 2017-06-12 DIAGNOSIS — N183 Chronic kidney disease, stage 3 (moderate): Secondary | ICD-10-CM | POA: Diagnosis not present

## 2017-06-12 DIAGNOSIS — E559 Vitamin D deficiency, unspecified: Secondary | ICD-10-CM | POA: Diagnosis not present

## 2017-06-12 DIAGNOSIS — Z23 Encounter for immunization: Secondary | ICD-10-CM | POA: Diagnosis not present

## 2017-06-13 LAB — CYTOLOGY - PAP
Diagnosis: NEGATIVE
HPV: NOT DETECTED

## 2017-07-24 DIAGNOSIS — N183 Chronic kidney disease, stage 3 (moderate): Secondary | ICD-10-CM | POA: Diagnosis not present

## 2017-07-31 DIAGNOSIS — I10 Essential (primary) hypertension: Secondary | ICD-10-CM | POA: Diagnosis not present

## 2017-07-31 DIAGNOSIS — N183 Chronic kidney disease, stage 3 (moderate): Secondary | ICD-10-CM | POA: Diagnosis not present

## 2017-07-31 DIAGNOSIS — N028 Recurrent and persistent hematuria with other morphologic changes: Secondary | ICD-10-CM | POA: Diagnosis not present

## 2017-09-21 DIAGNOSIS — E039 Hypothyroidism, unspecified: Secondary | ICD-10-CM | POA: Diagnosis not present

## 2017-09-21 DIAGNOSIS — E785 Hyperlipidemia, unspecified: Secondary | ICD-10-CM | POA: Diagnosis not present

## 2017-10-23 DIAGNOSIS — N183 Chronic kidney disease, stage 3 (moderate): Secondary | ICD-10-CM | POA: Diagnosis not present

## 2017-11-07 DIAGNOSIS — H524 Presbyopia: Secondary | ICD-10-CM | POA: Diagnosis not present

## 2017-12-13 DIAGNOSIS — R0681 Apnea, not elsewhere classified: Secondary | ICD-10-CM | POA: Diagnosis not present

## 2017-12-13 DIAGNOSIS — G4733 Obstructive sleep apnea (adult) (pediatric): Secondary | ICD-10-CM | POA: Diagnosis not present

## 2017-12-20 DIAGNOSIS — G4733 Obstructive sleep apnea (adult) (pediatric): Secondary | ICD-10-CM | POA: Diagnosis not present

## 2017-12-24 DIAGNOSIS — G4733 Obstructive sleep apnea (adult) (pediatric): Secondary | ICD-10-CM | POA: Diagnosis not present

## 2018-01-02 DIAGNOSIS — G4733 Obstructive sleep apnea (adult) (pediatric): Secondary | ICD-10-CM | POA: Diagnosis not present

## 2018-02-01 DIAGNOSIS — G4733 Obstructive sleep apnea (adult) (pediatric): Secondary | ICD-10-CM | POA: Diagnosis not present

## 2018-02-09 ENCOUNTER — Ambulatory Visit
Admission: RE | Admit: 2018-02-09 | Discharge: 2018-02-09 | Disposition: A | Discharge status: Home / Self Care | Payer: Commercial Managed Care - PPO | Source: Ambulatory Visit | Attending: Physician Assistant | Admitting: Physician Assistant

## 2018-02-09 ENCOUNTER — Other Ambulatory Visit: Payer: Self-pay | Admitting: Physician Assistant

## 2018-02-09 DIAGNOSIS — M7989 Other specified soft tissue disorders: Secondary | ICD-10-CM

## 2018-02-09 DIAGNOSIS — E039 Hypothyroidism, unspecified: Secondary | ICD-10-CM | POA: Diagnosis not present

## 2018-02-09 DIAGNOSIS — M19041 Primary osteoarthritis, right hand: Secondary | ICD-10-CM | POA: Diagnosis not present

## 2018-02-09 DIAGNOSIS — R600 Localized edema: Secondary | ICD-10-CM | POA: Diagnosis not present

## 2018-02-09 DIAGNOSIS — H43392 Other vitreous opacities, left eye: Secondary | ICD-10-CM | POA: Diagnosis not present

## 2018-02-09 DIAGNOSIS — I129 Hypertensive chronic kidney disease with stage 1 through stage 4 chronic kidney disease, or unspecified chronic kidney disease: Secondary | ICD-10-CM | POA: Diagnosis not present

## 2018-02-09 DIAGNOSIS — H2513 Age-related nuclear cataract, bilateral: Secondary | ICD-10-CM | POA: Diagnosis not present

## 2018-02-09 DIAGNOSIS — H04123 Dry eye syndrome of bilateral lacrimal glands: Secondary | ICD-10-CM | POA: Diagnosis not present

## 2018-02-22 DIAGNOSIS — N028 Recurrent and persistent hematuria with other morphologic changes: Secondary | ICD-10-CM | POA: Diagnosis not present

## 2018-02-22 DIAGNOSIS — I10 Essential (primary) hypertension: Secondary | ICD-10-CM | POA: Diagnosis not present

## 2018-02-22 DIAGNOSIS — N183 Chronic kidney disease, stage 3 (moderate): Secondary | ICD-10-CM | POA: Diagnosis not present

## 2018-03-04 DIAGNOSIS — G4733 Obstructive sleep apnea (adult) (pediatric): Secondary | ICD-10-CM | POA: Diagnosis not present

## 2018-03-09 ENCOUNTER — Ambulatory Visit (INDEPENDENT_AMBULATORY_CARE_PROVIDER_SITE_OTHER): Payer: Commercial Managed Care - PPO | Admitting: Ophthalmology

## 2018-03-09 ENCOUNTER — Encounter (INDEPENDENT_AMBULATORY_CARE_PROVIDER_SITE_OTHER): Payer: Self-pay | Admitting: Ophthalmology

## 2018-03-09 DIAGNOSIS — H33311 Horseshoe tear of retina without detachment, right eye: Secondary | ICD-10-CM | POA: Diagnosis not present

## 2018-03-09 DIAGNOSIS — H3581 Retinal edema: Secondary | ICD-10-CM | POA: Diagnosis not present

## 2018-03-09 DIAGNOSIS — H33011 Retinal detachment with single break, right eye: Secondary | ICD-10-CM

## 2018-03-09 DIAGNOSIS — H43392 Other vitreous opacities, left eye: Secondary | ICD-10-CM | POA: Diagnosis not present

## 2018-03-09 DIAGNOSIS — H04123 Dry eye syndrome of bilateral lacrimal glands: Secondary | ICD-10-CM | POA: Diagnosis not present

## 2018-03-09 DIAGNOSIS — H43811 Vitreous degeneration, right eye: Secondary | ICD-10-CM

## 2018-03-09 DIAGNOSIS — H25813 Combined forms of age-related cataract, bilateral: Secondary | ICD-10-CM

## 2018-03-09 MED ORDER — PREDNISOLONE ACETATE 1 % OP SUSP: 1.0000 [drp] | Freq: Four times a day (QID) | OPHTHALMIC | 0 refills | Status: AC

## 2018-03-09 NOTE — Progress Notes (Signed)
[facility] - Clinic Note  03/09/2018     CHIEF COMPLAINT Patient presents for Retina Evaluation   HISTORY OF PRESENT ILLNESS: Stacey Mullins is a 57 y.o. female who presents to the clinic today for:   HPI    Retina Evaluation    In both eyes.  This started 1 month ago.  Associated Symptoms Floaters and Photophobia.  Negative for Flashes, Pain, Trauma, Fever, Weight Loss, Scalp Tenderness, Redness, Distortion, Jaw Claudication, Fatigue, Shoulder/Hip pain, Glare and Blind Spot.  Context:  distance vision, mid-range vision and near vision.  Treatments tried include no treatments.  I, the attending physician,  performed the HPI with the patient and updated documentation appropriately.          Comments    Referral of Dr. Allena KatzPatel for retina evaluation/Eval sup. Temp.tear w/vit. Detach./ Degen. Patient states appx a month ago she noticed "a hair in my eyeball in the upper rt corner'', but found out it wasn't when she wiped at her eye,, when driving at night she felt as she had "liquid lights " OD. On the 7626 th of May she noticed the "hair" had moved to the central part of her vision.. Pt reports appx.2013  her right swelled shut, MRI was done ,she was Dx with edema behind the eye, she was given Antibiotic gtt's, issue  was resolved  , she did not have any eye issues until recently. Pt denies ggt's/vit's       Last edited by Rennis ChrisZamora, Camika Marsico, MD on 03/09/2018  3:31 PM. (History)    Pt states 4 weeks ago she was seen by Dr. Allena KatzPatel for PVD; Pt states she was being seen today for follow up and states Dr. Allena KatzPatel told her there was a tear in OD;   Referring physician: Everardo Mullins, Niyati, OD [address] [place], [place] [id number]  HISTORICAL INFORMATION:   Selected notes from the MEDICAL RECORD NUMBER Referred by Dr. Celene SkeenN. Mullins for concern of RT and PVD OD;  LEE- 06.07.19 (N. Mullins) [BCVA OD: 20/25+1 OS: 20/20-1] Ocular Hx- vitreous degen OD, horseshoe tear OD, DES OU,  vitreous opacities OS, catarac OU PMH- HTN, thyroid disease, CKD    CURRENT MEDICATIONS: Current Outpatient Medications (Ophthalmic Drugs)  Medication Sig  . prednisoLONE acetate (PRED FORTE) 1 % ophthalmic suspension Place 1 drop into the right eye 4 (four) times daily for 7 days.   No current facility-administered medications for this visit.  (Ophthalmic Drugs)   Current Outpatient Medications (Other)  Medication Sig  . allopurinol (ZYLOPRIM) 300 MG tablet Take 300 mg by mouth daily.  Marland Kitchen. amLODipine (NORVASC) 5 MG tablet Take 5 mg by mouth daily.  . Cholecalciferol (VITAMIN D3) 2000 UNITS capsule Take 2,000 Units by mouth daily.  Marland Kitchen. levothyroxine (SYNTHROID, LEVOTHROID) 150 MCG tablet Take 150 mcg by mouth daily before breakfast.  . losartan (COZAAR) 100 MG tablet Take 100 mg by mouth daily.  . metoprolol tartrate (LOPRESSOR) 25 MG tablet Take 25 mg by mouth daily.   No current facility-administered medications for this visit.  (Other)      REVIEW OF SYSTEMS: ROS    Positive for: Eyes   Negative for: Constitutional, Gastrointestinal, Neurological, Skin, Genitourinary, Musculoskeletal, HENT, Endocrine, Cardiovascular, Respiratory, Psychiatric, Allergic/Imm, Heme/Lymph   Last edited by Stacey Mullins, Glenda, LPN on 6/0/45406/04/2018  3:21 PM. (History)       ALLERGIES Allergies  Allergen Reactions  . Benicar [Olmesartan]     Coughing   . Erythromycin  Thrush    PAST MEDICAL HISTORY Past Medical History:  Diagnosis Date  . Chronic kidney disease    proteinuria  . Gout   . Hypertension   . Hypothyroid   . Thyroid disease    Past Surgical History:  Procedure Laterality Date  . RENAL BIOPSY     NGA nephropathy    FAMILY HISTORY Family History  Problem Relation Age of Onset  . Arthritis/Rheumatoid Mother   . Thyroid disease Mother   . Factor V Leiden deficiency Mother   . Hypertension Father     SOCIAL HISTORY Social History   Tobacco Use  . Smoking status: Never  Smoker  . Smokeless tobacco: Never Used  Substance Use Topics  . Alcohol use: No  . Drug use: No         OPHTHALMIC EXAM:  Base Eye Exam    Visual Acuity (Snellen - Linear)      Right Left   Dist [place] 20/25 -2 20/20 -1   Dist ph [place] 20/25 -1 NI  Pt dilated       Tonometry (Tonopen, 3:16 PM)      Right Left   Pressure 13 14       Pupils      Dark Light Shape React APD   Right 8 8 Round NR None   Left 8 8 Round NR None       Visual Fields (Counting fingers)      Left Right    Full Full       Extraocular Movement      Right Left    Full, Ortho Full, Ortho       Neuro/Psych    Oriented x3:  Yes   Mood/Affect:  Normal       Dilation    Both eyes:  1.0% Mydriacyl, 2.5% Phenylephrine @ 3:16 PM        Slit Lamp and Fundus Exam    Slit Lamp Exam      Right Left   Lids/Lashes Dermatochalasis - upper lid Dermatochalasis - upper lid   Conjunctiva/Sclera White and quiet White and quiet   Cornea Mild Arcus Mild Arcus   Anterior Chamber Deep and quiet Deep and quiet   Iris Round and well dilated Round and well dilated   Lens 1+ Nuclear sclerosis, 2+ Cortical cataract 1+ Nuclear sclerosis, 2+ Cortical cataract   Vitreous Vitreous syneresis, no tobacco dust, Posterior vitreous detachment Mild Vitreous syneresis       Fundus Exam      Right Left   Disc Pink and Sharp Pink and Sharp   C/D Ratio 0.4 0.55   Macula Good foveal reflex, No heme or edema Good foveal reflex, No heme or edema   Vessels Normal Normal   Periphery Attached, Horseshoe tear at 1030 with bridging vessel with cuff of SRF and surrounding heme, mild pre-retinal heme settling inferiorly, mild pigmented lattice degeneration inferiorly Attached          IMAGING AND PROCEDURES  Imaging and Procedures for @TODAY @  OCT, Retina - OU - Both Eyes       Right Eye Quality was good. Central Foveal Thickness: 278. Progression has no prior data. Findings include normal foveal contour, no IRF, no SRF.    Left Eye Quality was good. Central Foveal Thickness: 301. Progression has no prior data. Findings include normal foveal contour, no IRF, no SRF.   Notes *Images captured and stored on drive  Diagnosis / Impression:  NFP, No IRF/SRF  OU  Clinical management:  See below  Abbreviations: NFP - Normal foveal profile. CME - cystoid macular edema. PED - pigment epithelial detachment. IRF - intraretinal fluid. SRF - subretinal fluid. EZ - ellipsoid zone. ERM - epiretinal membrane. ORA - outer retinal atrophy. ORT - outer retinal tubulation. SRHM - subretinal hyper-reflective material         Repair Retinal Detach, Photocoag - OD - Right Eye       LASER PROCEDURE NOTE  Procedure:  Barrier laser retinopexy using slit lamp laser, RIGHT eye   Diagnosis:   Retinal tear with focal retinal detachment, RIGHT eye                     Horseshoe tear at 1030 o'clock anterior to equator   Surgeon: Rennis Chris, MD, PhD  Anesthesia: Topical  Informed consent obtained, operative eye marked, and time out performed prior to initiation of laser.   Laser settings:  Lumenis Smart532 laser, slit lamp Lens: Mainster PRP 165 Power: 280 mW Spot size: 200 microns Duration: 40 msec  # spots: 478  Placement of laser: Using a Mainster PRP 165 contact lens at the slit lamp, laser was placed in three confluent rows around flap tear w/ focal detachment at 1030 oclock anterior to equator with additional rows anteriorly.  Complications: None.  Patient tolerated the procedure well and received written and verbal post-procedure care information/education.                  ASSESSMENT/PLAN:    ICD-10-CM   1. 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 Retinal detachment of right eye with single break H33.011 Repair Retinal Detach, Photocoag - OD - Right Eye  3. Posterior vitreous detachment of right eye H43.811   4. Retinal edema H35.81  OCT, Retina - OU - Both Eyes  5. Combined forms of age-related cataract of both eyes H25.813     1,2. Horseshoe retinal tear w/ focal retinal detachment, OD  - The incidence, risk factors, and natural history of retinal tear was discussed with patient.   - Potential treatment options including laser retinopexy and cryotherapy discussed with patient. - horseshoe tear at 1030 with bridging vessel and surrounding SRF - recommend laser retinopexy OD - RBA of procedure discussed, questions answered - informed consent obtained and signed - see procedure note - start PF QID OD x7 days - f/u in 1 wk  3. PVD / vitreous syneresis OS  Discussed findings and prognosis  No other RT or RD on 360 peripheral exam OS  Reviewed s/s of RT/RD  Strict return precautions for any such RT/RD signs/symptoms  4. No retinal edema on exam or OCT  5. Combined form age-related cataract OU-  - The symptoms of cataract, surgical options, and treatments and risks were discussed with patient. - discussed diagnosis and progression - not yet visually significant - monitor for now    Ophthalmic Meds Ordered this visit:  Meds ordered this encounter  Medications  . prednisoLONE acetate (PRED FORTE) 1 % ophthalmic suspension    Sig: Place 1 drop into the right eye 4 (four) times daily for 7 days.    Dispense:  10 mL    Refill:  0       Return in about 1 week (around 03/16/2018) for F/U laser retinopexy OD.  There are no Patient Instructions on file for this visit.   Explained the diagnoses,  plan, and follow up with the patient and they expressed understanding.  Patient expressed understanding of the importance of proper follow up care.   This document serves as a record of services personally performed by Karie Chimera, MD, PhD. It was created on their behalf by Virgilio Belling, COA, a certified ophthalmic assistant. The creation of this record is the provider's dictation and/or activities during the  visit.  Electronically signed by: Virgilio Belling, COA  06.07.19 4:40 PM   Karie Chimera, M.D., Ph.D. Diseases & Surgery of the Retina and Vitreous [facility]  I have reviewed the above documentation for accuracy and completeness, and I agree with the above. Karie Chimera, M.D., Ph.D. 03/09/18 4:40 PM     Abbreviations: M myopia (nearsighted); A astigmatism; H hyperopia (farsighted); P presbyopia; Mrx spectacle prescription;  CTL contact lenses; OD right eye; OS left eye; OU both eyes  XT exotropia; ET esotropia; PEK punctate epithelial keratitis; PEE punctate epithelial erosions; DES dry eye syndrome; MGD meibomian gland dysfunction; ATs artificial tears; PFAT's preservative free artificial tears; NSC nuclear sclerotic cataract; PSC posterior subcapsular cataract; ERM epi-retinal membrane; PVD posterior vitreous detachment; RD retinal detachment; DM diabetes mellitus; DR diabetic retinopathy; NPDR non-proliferative diabetic retinopathy; PDR proliferative diabetic retinopathy; CSME clinically significant macular edema; DME diabetic macular edema; dbh dot blot hemorrhages; CWS cotton wool spot; POAG primary open angle glaucoma; C/D cup-to-disc ratio; HVF humphrey visual field; GVF goldmann visual field; OCT optical coherence tomography; IOP intraocular pressure; BRVO Branch retinal vein occlusion; CRVO central retinal vein occlusion; CRAO central retinal artery occlusion; BRAO branch retinal artery occlusion; RT retinal tear; SB scleral buckle; PPV pars plana vitrectomy; VH Vitreous hemorrhage; PRP panretinal laser photocoagulation; IVK intravitreal kenalog; VMT vitreomacular traction; MH Macular hole;  NVD neovascularization of the disc; NVE neovascularization elsewhere; AREDS age related eye disease study; ARMD age related macular degeneration; POAG primary open angle glaucoma; EBMD epithelial/anterior basement membrane dystrophy; ACIOL anterior chamber intraocular lens;  IOL intraocular lens; PCIOL posterior chamber intraocular lens; Phaco/IOL phacoemulsification with intraocular lens placement; PRK photorefractive keratectomy; LASIK laser assisted in situ keratomileusis; HTN hypertension; DM diabetes mellitus; COPD chronic obstructive pulmonary disease

## 2018-03-13 DIAGNOSIS — M79641 Pain in right hand: Secondary | ICD-10-CM | POA: Diagnosis not present

## 2018-03-13 DIAGNOSIS — M1A09X0 Idiopathic chronic gout, multiple sites, without tophus (tophi): Secondary | ICD-10-CM | POA: Diagnosis not present

## 2018-03-13 DIAGNOSIS — M150 Primary generalized (osteo)arthritis: Secondary | ICD-10-CM | POA: Diagnosis not present

## 2018-03-15 NOTE — Progress Notes (Signed)
[facility] - Clinic Note  03/16/2018     CHIEF COMPLAINT Patient presents for Retina Follow Up   HISTORY OF PRESENT ILLNESS: Stacey Mullins is a 57 y.o. female who presents to the clinic today for:   HPI    Retina Follow Up    Patient presents with  Other.  In right eye.  This started 3 weeks ago.  Severity is mild.  Since onset it is stable.  I, the attending physician,  performed the HPI with the patient and updated documentation appropriately.          Comments    F/U laser  Retinopexy OD (03/09/18) Patient states she has occasional "quiver" lower rt part of OD, she had pain above her eye Os yesterday, she took tylenol which relieved it, her vision "is about the same', No new visual onsets. Pt is compliant with eye gtt's       Last edited by Rennis Chris, MD on 03/16/2018  2:01 PM. (History)      Referring physician: Laurann Montana, MD [phone] [place], [place] [address]  HISTORICAL INFORMATION:   Selected notes from the MEDICAL RECORD NUMBER Referred by Dr. Celene Skeen for concern of RT and PVD OD;  LEE- 06.07.19 (N. Patel) [BCVA OD: 20/25+1 OS: 20/20-1] Ocular Hx- vitreous degen OD, horseshoe tear OD, DES OU, vitreous opacities OS, catarac OU PMH- HTN, thyroid disease, CKD    CURRENT MEDICATIONS: No current outpatient medications on file. (Ophthalmic Drugs)   No current facility-administered medications for this visit.  (Ophthalmic Drugs)   Current Outpatient Medications (Other)  Medication Sig  . allopurinol (ZYLOPRIM) 300 MG tablet Take 300 mg by mouth daily.  Marland Kitchen amLODipine (NORVASC) 5 MG tablet Take 5 mg by mouth daily.  . Cholecalciferol (VITAMIN D3) 2000 UNITS capsule Take 2,000 Units by mouth daily.  Marland Kitchen levothyroxine (SYNTHROID, LEVOTHROID) 150 MCG tablet Take 150 mcg by mouth daily before breakfast.  . losartan (COZAAR) 100 MG tablet Take 100 mg by mouth daily.  . metoprolol tartrate (LOPRESSOR) 25 MG tablet Take 25 mg by  mouth daily.   No current facility-administered medications for this visit.  (Other)      REVIEW OF SYSTEMS: ROS    Positive for: Eyes   Negative for: Constitutional, Gastrointestinal, Neurological, Skin, Genitourinary, Musculoskeletal, HENT, Endocrine, Cardiovascular, Respiratory, Psychiatric, Allergic/Imm, Heme/Lymph   Last edited by Eldridge Scot, LPN on 01/09/8118  2:00 PM. (History)       ALLERGIES Allergies  Allergen Reactions  . Benicar [Olmesartan]     Coughing   . Erythromycin     Thrush    PAST MEDICAL HISTORY Past Medical History:  Diagnosis Date  . Chronic kidney disease    proteinuria  . Gout   . Hypertension   . Hypothyroid   . Thyroid disease    Past Surgical History:  Procedure Laterality Date  . RENAL BIOPSY     NGA nephropathy    FAMILY HISTORY Family History  Problem Relation Age of Onset  . Arthritis/Rheumatoid Mother   . Thyroid disease Mother   . Factor V Leiden deficiency Mother   . Hypertension Father     SOCIAL HISTORY Social History   Tobacco Use  . Smoking status: Never Smoker  . Smokeless tobacco: Never Used  Substance Use Topics  . Alcohol use: No  . Drug use: No         OPHTHALMIC EXAM:  Base Eye Exam  Visual Acuity (Snellen - Linear)      Right Left   Dist [place] 20/30 20/20   Dist ph [place] 20/20 -1        Tonometry (Tonopen, 1:48 PM)      Right Left   Pressure 15 19       Pupils      Dark Light Shape React APD   Right 6 4 Round Brisk None   Left 6 4 Round 4 None       Extraocular Movement      Right Left    Full Full       Neuro/Psych    Oriented x3:  Yes   Mood/Affect:  Normal       Dilation    Both eyes:  1.0% Mydriacyl, 2.5% Phenylephrine @ 1:48 PM        Slit Lamp and Fundus Exam    Slit Lamp Exam      Right Left   Lids/Lashes Dermatochalasis - upper lid Dermatochalasis - upper lid   Conjunctiva/Sclera White and quiet White and quiet   Cornea Mild Arcus Mild Arcus   Anterior  Chamber Deep and quiet Deep and quiet   Iris Round and well dilated Round and well dilated   Lens 1+ Nuclear sclerosis, 2+ Cortical cataract 1+ Nuclear sclerosis, 2+ Cortical cataract   Vitreous Vitreous syneresis, no tobacco dust, Posterior vitreous detachment Mild Vitreous syneresis       Fundus Exam      Right Left   Disc Pink and Sharp Pink and Sharp   C/D Ratio 0.4 0.55   Macula Good foveal reflex, No heme or edema Good foveal reflex, No heme or edema   Vessels Normal Normal   Periphery Attached, Horseshoe tear at 1030 with bridging vessel with cuff of SRF and surrounding heme -- good laser changes 360 around tear and SRF, mild pre-retinal heme settling inferiorly, mild pigmented lattice degeneration inferiorly Attached          IMAGING AND PROCEDURES  Imaging and Procedures for @TODAY @           ASSESSMENT/PLAN:    ICD-10-CM   1. 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 Retinal detachment of right eye with single break H33.011   3. Posterior vitreous detachment of right eye H43.811   4. Retinal edema H35.81   5. Combined forms of age-related cataract of both eyes H25.813     1,2. Horseshoe retinal tear w/ focal retinal detachment, OD  - horseshoe tear at 1030 with bridging vessel and surrounding SRF - S/P laser retinopexy OD (06.07.19) -- good surrounding laser in place - finishing PF QID OD x7 days - f/u 2 weeks  3. PVD / vitreous syneresis OS  Discussed findings and prognosis  No other RT or RD on 360 peripheral exam OS  Reviewed s/s of RT/RD  Strict return precautions for any such RT/RD signs/symptoms  4. No retinal edema on exam or OCT  5. Combined form age-related cataract OU-  - The symptoms of cataract, surgical options, and treatments and risks were discussed with patient. - discussed diagnosis and progression - not yet visually significant - monitor for now    Ophthalmic Meds Ordered this visit:  No orders of the defined types were  placed in this encounter.      Return in about 2 weeks (around 03/30/2018) for POV laser retinopexy OD, DFE, OCT.  There are no Patient Instructions on file for this visit.  Explained the diagnoses, plan, and follow up with the patient and they expressed understanding.  Patient expressed understanding of the importance of proper follow up care.   This document serves as a record of services personally performed by Karie Chimera, MD, PhD. It was created on their behalf by Laurian Brim, OA, an ophthalmic assistant. The creation of this record is the provider's dictation and/or activities during the visit.    Electronically signed by: Laurian Brim, OA  06.13.2019 10:51 PM   This document serves as a record of services personally performed by Karie Chimera, MD, PhD. It was created on their behalf by Virgilio Belling, COA, a certified ophthalmic assistant. The creation of this record is the provider's dictation and/or activities during the visit.  Electronically signed by: Virgilio Belling, COA  06.14.19 10:51 PM   Karie Chimera, M.D., Ph.D. Diseases & Surgery of the Retina and Vitreous [facility]   I have reviewed the above documentation for accuracy and completeness, and I agree with the above. Karie Chimera, M.D., Ph.D. 03/19/18 10:52 PM     Abbreviations: M myopia (nearsighted); A astigmatism; H hyperopia (farsighted); P presbyopia; Mrx spectacle prescription;  CTL contact lenses; OD right eye; OS left eye; OU both eyes  XT exotropia; ET esotropia; PEK punctate epithelial keratitis; PEE punctate epithelial erosions; DES dry eye syndrome; MGD meibomian gland dysfunction; ATs artificial tears; PFAT's preservative free artificial tears; NSC nuclear sclerotic cataract; PSC posterior subcapsular cataract; ERM epi-retinal membrane; PVD posterior vitreous detachment; RD retinal detachment; DM diabetes mellitus; DR diabetic retinopathy; NPDR non-proliferative  diabetic retinopathy; PDR proliferative diabetic retinopathy; CSME clinically significant macular edema; DME diabetic macular edema; dbh dot blot hemorrhages; CWS cotton wool spot; POAG primary open angle glaucoma; C/D cup-to-disc ratio; HVF humphrey visual field; GVF goldmann visual field; OCT optical coherence tomography; IOP intraocular pressure; BRVO Branch retinal vein occlusion; CRVO central retinal vein occlusion; CRAO central retinal artery occlusion; BRAO branch retinal artery occlusion; RT retinal tear; SB scleral buckle; PPV pars plana vitrectomy; VH Vitreous hemorrhage; PRP panretinal laser photocoagulation; IVK intravitreal kenalog; VMT vitreomacular traction; MH Macular hole;  NVD neovascularization of the disc; NVE neovascularization elsewhere; AREDS age related eye disease study; ARMD age related macular degeneration; POAG primary open angle glaucoma; EBMD epithelial/anterior basement membrane dystrophy; ACIOL anterior chamber intraocular lens; IOL intraocular lens; PCIOL posterior chamber intraocular lens; Phaco/IOL phacoemulsification with intraocular lens placement; PRK photorefractive keratectomy; LASIK laser assisted in situ keratomileusis; HTN hypertension; DM diabetes mellitus; COPD chronic obstructive pulmonary disease

## 2018-03-16 ENCOUNTER — Encounter (INDEPENDENT_AMBULATORY_CARE_PROVIDER_SITE_OTHER): Payer: Self-pay | Admitting: Ophthalmology

## 2018-03-16 ENCOUNTER — Ambulatory Visit (INDEPENDENT_AMBULATORY_CARE_PROVIDER_SITE_OTHER): Payer: Commercial Managed Care - PPO | Admitting: Ophthalmology

## 2018-03-16 DIAGNOSIS — H33311 Horseshoe tear of retina without detachment, right eye: Secondary | ICD-10-CM

## 2018-03-16 DIAGNOSIS — H3581 Retinal edema: Secondary | ICD-10-CM

## 2018-03-16 DIAGNOSIS — H25813 Combined forms of age-related cataract, bilateral: Secondary | ICD-10-CM

## 2018-03-16 DIAGNOSIS — H43811 Vitreous degeneration, right eye: Secondary | ICD-10-CM

## 2018-03-16 DIAGNOSIS — H33011 Retinal detachment with single break, right eye: Secondary | ICD-10-CM

## 2018-03-19 ENCOUNTER — Encounter (INDEPENDENT_AMBULATORY_CARE_PROVIDER_SITE_OTHER): Payer: Self-pay | Admitting: Ophthalmology

## 2018-03-19 DIAGNOSIS — G4733 Obstructive sleep apnea (adult) (pediatric): Secondary | ICD-10-CM | POA: Diagnosis not present

## 2018-03-29 NOTE — Progress Notes (Signed)
[facility] - Clinic Note  03/30/2018     CHIEF COMPLAINT Patient presents for Retina Follow Up   HISTORY OF PRESENT ILLNESS: Stacey Mullins is a 57 y.o. female who presents to the clinic today for:   HPI    Retina Follow Up    Patient presents with  Other.  In right eye.  Severity is moderate.  Duration of 2 weeks.  Since onset it is stable.  I, the attending physician,  performed the HPI with the patient and updated documentation appropriately.          Comments    Pt presents for horseshoe tear OD F/U, pt states VA is the same, she is experiencing the same floaters, but no pain, flashes or wavy vision, pt discontinued PF as directed,        Last edited by Rennis Chris, MD on 03/30/2018  3:49 PM. (History)      Referring physician: Laurann Montana, MD [phone] [address] [place], [place] [address]  HISTORICAL INFORMATION:   Selected notes from the MEDICAL RECORD NUMBER Referred by Dr. Celene Skeen for concern of RT and PVD OD;  LEE- 06.07.19 (N. Patel) [BCVA OD: 20/25+1 OS: 20/20-1] Ocular Hx- vitreous degen OD, horseshoe tear OD, DES OU, vitreous opacities OS, catarac OU PMH- HTN, thyroid disease, CKD    CURRENT MEDICATIONS: No current outpatient medications on file. (Ophthalmic Drugs)   No current facility-administered medications for this visit.  (Ophthalmic Drugs)   Current Outpatient Medications (Other)  Medication Sig  . allopurinol (ZYLOPRIM) 300 MG tablet Take 300 mg by mouth daily.  Marland Kitchen amLODipine (NORVASC) 5 MG tablet Take 5 mg by mouth daily.  . Cholecalciferol (VITAMIN D3) 2000 UNITS capsule Take 2,000 Units by mouth daily.  Marland Kitchen levothyroxine (SYNTHROID, LEVOTHROID) 150 MCG tablet Take 150 mcg by mouth daily before breakfast.  . losartan (COZAAR) 100 MG tablet Take 100 mg by mouth daily.  . metoprolol tartrate (LOPRESSOR) 25 MG tablet Take 25 mg by mouth daily.  Marland Kitchen spironolactone (ALDACTONE) 25 MG tablet    No current  facility-administered medications for this visit.  (Other)      REVIEW OF SYSTEMS: ROS    Positive for: Endocrine, Cardiovascular, Eyes   Negative for: Constitutional, Gastrointestinal, Neurological, Skin, Genitourinary, Musculoskeletal, HENT, Respiratory, Psychiatric, Allergic/Imm, Heme/Lymph   Last edited by Posey Boyer, COT on 03/30/2018  2:37 PM. (History)       ALLERGIES Allergies  Allergen Reactions  . Benicar [Olmesartan]     Coughing   . Erythromycin     Thrush    PAST MEDICAL HISTORY Past Medical History:  Diagnosis Date  . Chronic kidney disease    proteinuria  . Gout   . Hypertension   . Hypothyroid   . Thyroid disease    Past Surgical History:  Procedure Laterality Date  . RENAL BIOPSY     NGA nephropathy    FAMILY HISTORY Family History  Problem Relation Age of Onset  . Arthritis/Rheumatoid Mother   . Thyroid disease Mother   . Factor V Leiden deficiency Mother   . Hypertension Father     SOCIAL HISTORY Social History   Tobacco Use  . Smoking status: Never Smoker  . Smokeless tobacco: Never Used  Substance Use Topics  . Alcohol use: No  . Drug use: No         OPHTHALMIC EXAM:  Base Eye Exam    Visual Acuity (Snellen - Linear)  Right Left   Dist [place] 20/25 -1 20/20 -2   Dist ph [place] 20/20 -1 20/20 -1       Tonometry (Tonopen, 2:43 PM)      Right Left   Pressure 15 19       Pupils      Dark Light Shape React APD   Right 4 2 Round Brisk None   Left 4 2 Round Brisk None       Visual Fields (Counting fingers)      Left Right    Full Full       Extraocular Movement      Right Left    Full, Ortho Full, Ortho       Neuro/Psych    Oriented x3:  Yes   Mood/Affect:  Normal       Dilation    Both eyes:  1.0% Mydriacyl, 10% Phenylephrine @ 2:43 PM  We are out of 2.5% Phenylephrine        Slit Lamp and Fundus Exam    Slit Lamp Exam      Right Left   Lids/Lashes Dermatochalasis - upper lid Dermatochalasis -  upper lid   Conjunctiva/Sclera White and quiet White and quiet   Cornea Mild Arcus Mild Arcus   Anterior Chamber Deep and quiet Deep and quiet   Iris Round and well dilated Round and well dilated   Lens 1+ Nuclear sclerosis, 2+ Cortical cataract 1+ Nuclear sclerosis, 2+ Cortical cataract   Vitreous Vitreous syneresis, no tobacco dust, Posterior vitreous detachment Mild Vitreous syneresis       Fundus Exam      Right Left   Disc Pink and Sharp Pink and Sharp   C/D Ratio 0.4 0.55   Macula Good foveal reflex, No heme or edema Good foveal reflex, No heme or edema   Vessels Normal Normal   Periphery Attached, Horseshoe tear at 1030 with bridging vessel with cuff of SRF and surrounding heme -- good laser changes 360 around tear and SRF, mild pre-retinal heme settling inferiorly, mild pigmented lattice degeneration inferiorly Attached          IMAGING AND PROCEDURES  Imaging and Procedures for @TODAY @  OCT, Retina - OU - Both Eyes       Right Eye Quality was good. Central Foveal Thickness: 290. Progression has been stable. Findings include normal foveal contour, no IRF, no SRF.   Left Eye Quality was good. Central Foveal Thickness: 279. Progression has been stable. Findings include normal foveal contour, no IRF, no SRF.   Notes *Images captured and stored on drive  Diagnosis / Impression:  NFP, No IRF/SRF OU  Clinical management:  See below  Abbreviations: NFP - Normal foveal profile. CME - cystoid macular edema. PED - pigment epithelial detachment. IRF - intraretinal fluid. SRF - subretinal fluid. EZ - ellipsoid zone. ERM - epiretinal membrane. ORA - outer retinal atrophy. ORT - outer retinal tubulation. SRHM - subretinal hyper-reflective material                  ASSESSMENT/PLAN:    ICD-10-CM   1. 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 Retinal detachment of right eye with single break H33.011   3. Posterior vitreous detachment of right eye H43.811    4. Retinal edema H35.81 OCT, Retina - OU - Both Eyes  5. Combined forms of age-related cataract of both eyes H25.813     1,2. Horseshoe retinal tear w/ focal retinal detachment, OD  -  horseshoe tear at 1030 with bridging vessel and surrounding SRF - S/P laser retinopexy OD (06.07.19) -- good surrounding laser in place - no new tears or RD - finished PF QID OD x7 days - f/u 3 months  3. PVD / vitreous syneresis OS  Discussed findings and prognosis  No other RT or RD on 360 peripheral exam OS  Reviewed s/s of RT/RD  Strict return precaution for any such RT/RD signs/symptoms  4. No retinal edema on exam or OCT  5. Combined form age-related cataract OU-  - The symptoms of cataract, surgical options, and treatments and risks were discussed with patient. - discussed diagnosis and progression - not yet visually significant - monitor for now    Ophthalmic Meds Ordered this visit:  No orders of the defined types were placed in this encounter.      Return in about 3 months (around 06/30/2018) for F/U focal RD repair OD, DFE OU, OCT.  There are no Patient Instructions on file for this visit.   Explained the diagnoses, plan, and follow up with the patient and they expressed understanding.  Patient expressed understanding of the importance of proper follow up care.    This document serves as a record of services personally performed by Karie Chimera, MD, PhD. It was created on their behalf by Laurian Brim, OA, an ophthalmic assistant. The creation of this record is the provider's dictation and/or activities during the visit.    Electronically signed by: Laurian Brim, OA  06.27.2019 12:53 AM   This document serves as a record of services personally performed by Karie Chimera, MD, PhD. It was created on their behalf by Virgilio Belling, COA, a certified ophthalmic assistant. The creation of this record is the provider's dictation and/or activities during the visit.  Electronically  signed by: Virgilio Belling, COA  06.28.19 12:53 AM   Karie Chimera, M.D., Ph.D. Diseases & Surgery of the Retina and Vitreous [facility]   I have reviewed the above documentation for accuracy and completeness, and I agree with the above. Karie Chimera, M.D., Ph.D. 04/02/18 12:54 AM     Abbreviations: M myopia (nearsighted); A astigmatism; H hyperopia (farsighted); P presbyopia; Mrx spectacle prescription;  CTL contact lenses; OD right eye; OS left eye; OU both eyes  XT exotropia; ET esotropia; PEK punctate epithelial keratitis; PEE punctate epithelial erosions; DES dry eye syndrome; MGD meibomian gland dysfunction; ATs artificial tears; PFAT's preservative free artificial tears; NSC nuclear sclerotic cataract; PSC posterior subcapsular cataract; ERM epi-retinal membrane; PVD posterior vitreous detachment; RD retinal detachment; DM diabetes mellitus; DR diabetic retinopathy; NPDR non-proliferative diabetic retinopathy; PDR proliferative diabetic retinopathy; CSME clinically significant macular edema; DME diabetic macular edema; dbh dot blot hemorrhages; CWS cotton wool spot; POAG primary open angle glaucoma; C/D cup-to-disc ratio; HVF humphrey visual field; GVF goldmann visual field; OCT optical coherence tomography; IOP intraocular pressure; BRVO Branch retinal vein occlusion; CRVO central retinal vein occlusion; CRAO central retinal artery occlusion; BRAO branch retinal artery occlusion; RT retinal tear; SB scleral buckle; PPV pars plana vitrectomy; VH Vitreous hemorrhage; PRP panretinal laser photocoagulation; IVK intravitreal kenalog; VMT vitreomacular traction; MH Macular hole;  NVD neovascularization of the disc; NVE neovascularization elsewhere; AREDS age related eye disease study; ARMD age related macular degeneration; POAG primary open angle glaucoma; EBMD epithelial/anterior basement membrane dystrophy; ACIOL anterior chamber intraocular lens; IOL intraocular lens;  PCIOL posterior chamber intraocular lens; Phaco/IOL phacoemulsification with intraocular lens placement; PRK photorefractive keratectomy; LASIK laser assisted  in situ keratomileusis; HTN hypertension; DM diabetes mellitus; COPD chronic obstructive pulmonary disease

## 2018-03-30 ENCOUNTER — Encounter (INDEPENDENT_AMBULATORY_CARE_PROVIDER_SITE_OTHER): Payer: Self-pay | Admitting: Ophthalmology

## 2018-03-30 ENCOUNTER — Ambulatory Visit (INDEPENDENT_AMBULATORY_CARE_PROVIDER_SITE_OTHER): Payer: Commercial Managed Care - PPO | Admitting: Ophthalmology

## 2018-03-30 ENCOUNTER — Encounter (INDEPENDENT_AMBULATORY_CARE_PROVIDER_SITE_OTHER): Payer: Commercial Managed Care - PPO | Admitting: Ophthalmology

## 2018-03-30 DIAGNOSIS — H33011 Retinal detachment with single break, right eye: Secondary | ICD-10-CM

## 2018-03-30 DIAGNOSIS — H33311 Horseshoe tear of retina without detachment, right eye: Secondary | ICD-10-CM

## 2018-03-30 DIAGNOSIS — H25813 Combined forms of age-related cataract, bilateral: Secondary | ICD-10-CM

## 2018-03-30 DIAGNOSIS — H3581 Retinal edema: Secondary | ICD-10-CM | POA: Diagnosis not present

## 2018-03-30 DIAGNOSIS — H43811 Vitreous degeneration, right eye: Secondary | ICD-10-CM

## 2018-04-02 ENCOUNTER — Encounter (INDEPENDENT_AMBULATORY_CARE_PROVIDER_SITE_OTHER): Payer: Self-pay | Admitting: Ophthalmology

## 2018-04-03 DIAGNOSIS — G4733 Obstructive sleep apnea (adult) (pediatric): Secondary | ICD-10-CM | POA: Diagnosis not present

## 2018-04-09 ENCOUNTER — Other Ambulatory Visit: Payer: Self-pay | Admitting: Family Medicine

## 2018-04-09 DIAGNOSIS — G4733 Obstructive sleep apnea (adult) (pediatric): Secondary | ICD-10-CM | POA: Diagnosis not present

## 2018-04-09 DIAGNOSIS — Z1231 Encounter for screening mammogram for malignant neoplasm of breast: Secondary | ICD-10-CM

## 2018-04-25 DIAGNOSIS — E559 Vitamin D deficiency, unspecified: Secondary | ICD-10-CM | POA: Diagnosis not present

## 2018-04-25 DIAGNOSIS — R413 Other amnesia: Secondary | ICD-10-CM | POA: Diagnosis not present

## 2018-04-25 DIAGNOSIS — E039 Hypothyroidism, unspecified: Secondary | ICD-10-CM | POA: Diagnosis not present

## 2018-04-26 ENCOUNTER — Other Ambulatory Visit: Payer: Self-pay | Admitting: Family Medicine

## 2018-04-26 DIAGNOSIS — N649 Disorder of breast, unspecified: Secondary | ICD-10-CM

## 2018-04-26 DIAGNOSIS — N644 Mastodynia: Secondary | ICD-10-CM

## 2018-05-03 ENCOUNTER — Other Ambulatory Visit: Payer: Self-pay | Admitting: Family Medicine

## 2018-05-03 ENCOUNTER — Ambulatory Visit
Admission: RE | Admit: 2018-05-03 | Discharge: 2018-05-03 | Disposition: A | Discharge status: Home / Self Care | Payer: Commercial Managed Care - PPO | Source: Ambulatory Visit | Attending: Family Medicine | Admitting: Family Medicine

## 2018-05-03 DIAGNOSIS — N644 Mastodynia: Secondary | ICD-10-CM

## 2018-05-03 DIAGNOSIS — N6489 Other specified disorders of breast: Secondary | ICD-10-CM | POA: Diagnosis not present

## 2018-05-03 DIAGNOSIS — R922 Inconclusive mammogram: Secondary | ICD-10-CM | POA: Diagnosis not present

## 2018-05-03 DIAGNOSIS — Z1231 Encounter for screening mammogram for malignant neoplasm of breast: Secondary | ICD-10-CM

## 2018-05-03 DIAGNOSIS — N649 Disorder of breast, unspecified: Secondary | ICD-10-CM

## 2018-05-25 ENCOUNTER — Ambulatory Visit
Admission: RE | Admit: 2018-05-25 | Discharge: 2018-05-25 | Disposition: A | Discharge status: Home / Self Care | Payer: Commercial Managed Care - PPO | Source: Ambulatory Visit | Attending: Family Medicine | Admitting: Family Medicine

## 2018-05-25 ENCOUNTER — Other Ambulatory Visit: Payer: Self-pay | Admitting: Family Medicine

## 2018-05-25 DIAGNOSIS — Z1231 Encounter for screening mammogram for malignant neoplasm of breast: Secondary | ICD-10-CM

## 2018-06-14 DIAGNOSIS — E039 Hypothyroidism, unspecified: Secondary | ICD-10-CM | POA: Diagnosis not present

## 2018-06-14 DIAGNOSIS — I129 Hypertensive chronic kidney disease with stage 1 through stage 4 chronic kidney disease, or unspecified chronic kidney disease: Secondary | ICD-10-CM | POA: Diagnosis not present

## 2018-06-14 DIAGNOSIS — E785 Hyperlipidemia, unspecified: Secondary | ICD-10-CM | POA: Diagnosis not present

## 2018-06-14 DIAGNOSIS — R8290 Unspecified abnormal findings in urine: Secondary | ICD-10-CM | POA: Diagnosis not present

## 2018-06-14 DIAGNOSIS — E559 Vitamin D deficiency, unspecified: Secondary | ICD-10-CM | POA: Diagnosis not present

## 2018-06-14 DIAGNOSIS — G4730 Sleep apnea, unspecified: Secondary | ICD-10-CM | POA: Diagnosis not present

## 2018-06-14 DIAGNOSIS — Z23 Encounter for immunization: Secondary | ICD-10-CM | POA: Diagnosis not present

## 2018-06-14 DIAGNOSIS — N183 Chronic kidney disease, stage 3 (moderate): Secondary | ICD-10-CM | POA: Diagnosis not present

## 2018-06-14 DIAGNOSIS — M109 Gout, unspecified: Secondary | ICD-10-CM | POA: Diagnosis not present

## 2018-06-28 NOTE — Progress Notes (Signed)
[facility] - Clinic Note  06/29/2018     CHIEF COMPLAINT Patient presents for Retina Follow Up   HISTORY OF PRESENT ILLNESS: Stacey Mullins is a 57 y.o. female who presents to the clinic today for:   HPI    Retina Follow Up    Patient presents with  Other.  In right eye.  Severity is mild.  Since onset it is stable.  I, the attending physician,  performed the HPI with the patient and updated documentation appropriately.          Comments    F/u RD Repair OD S/P laser retinopexy OD (03/09/18) Patient states her vision is about the same, light sensitivity at night per patient. Denies new visual onsets/issues.           Last edited by Rennis Chris, MD on 07/02/2018 12:56 AM. (History)    pt states everything has been the same since last visit, denies new flashes/floaters, she has occasional pain superiorly OS  Referring physician: Laurann Montana, MD [phone] [address] [place], [place] [address]  HISTORICAL INFORMATION:   Selected notes from the MEDICAL RECORD NUMBER Referred by Dr. Celene Skeen for concern of RT and PVD OD;  LEE- 06.07.19 (N. Patel) [BCVA OD: 20/25+1 OS: 20/20-1] Ocular Hx- vitreous degen OD, horseshoe tear OD, DES OU, vitreous opacities OS, catarac OU PMH- HTN, thyroid disease, CKD    CURRENT MEDICATIONS: No current outpatient medications on file. (Ophthalmic Drugs)   No current facility-administered medications for this visit.  (Ophthalmic Drugs)   Current Outpatient Medications (Other)  Medication Sig  . allopurinol (ZYLOPRIM) 300 MG tablet Take 300 mg by mouth daily.  Marland Kitchen amLODipine (NORVASC) 5 MG tablet Take 5 mg by mouth daily.  . Cholecalciferol (VITAMIN D3) 2000 UNITS capsule Take 4,000 Units by mouth daily.   Marland Kitchen levothyroxine (SYNTHROID, LEVOTHROID) 150 MCG tablet Take 150 mcg by mouth daily before breakfast.  . losartan (COZAAR) 100 MG tablet Take 100 mg by mouth daily.  . metoprolol tartrate (LOPRESSOR) 25 MG tablet Take  25 mg by mouth daily.  . Multiple Vitamin (MULTIVITAMIN) capsule Take 1 capsule by mouth daily.  Marland Kitchen spironolactone (ALDACTONE) 25 MG tablet    No current facility-administered medications for this visit.  (Other)      REVIEW OF SYSTEMS: ROS    Positive for: Eyes   Negative for: Constitutional, Gastrointestinal, Neurological, Skin, Genitourinary, Musculoskeletal, HENT, Endocrine, Cardiovascular, Respiratory, Psychiatric, Allergic/Imm, Heme/Lymph   Last edited by Eldridge Scot, LPN on 7/56/4332  2:35 PM. (History)       ALLERGIES Allergies  Allergen Reactions  . Benicar [Olmesartan]     Coughing   . Erythromycin     Thrush    PAST MEDICAL HISTORY Past Medical History:  Diagnosis Date  . Chronic kidney disease    proteinuria  . Gout   . Hypertension   . Hypothyroid   . Thyroid disease    Past Surgical History:  Procedure Laterality Date  . RENAL BIOPSY     NGA nephropathy    FAMILY HISTORY Family History  Problem Relation Age of Onset  . Arthritis/Rheumatoid Mother   . Thyroid disease Mother   . Factor V Leiden deficiency Mother   . Hypertension Father     SOCIAL HISTORY Social History   Tobacco Use  . Smoking status: Never Smoker  . Smokeless tobacco: Never Used  Substance Use Topics  . Alcohol use: No  . Drug use: No  OPHTHALMIC EXAM:  Base Eye Exam    Visual Acuity (Snellen - Linear)      Right Left   Dist [place] 20/25 +2 20/20   Dist ph [place] 20/25 NI       Tonometry (Tonopen, 2:45 PM)      Right Left   Pressure 15 14       Pupils      Dark Light Shape React APD   Right 4 3 Round Brisk None   Left 4 3 Round Brisk None       Visual Fields (Counting fingers)      Left Right    Full Full       Extraocular Movement      Right Left    Full, Ortho Full, Ortho       Neuro/Psych    Oriented x3:  Yes   Mood/Affect:  Normal       Dilation    Both eyes:  1.0% Mydriacyl, 2.5% Phenylephrine @ 2:45 PM        Slit Lamp and  Fundus Exam    Slit Lamp Exam      Right Left   Lids/Lashes Dermatochalasis - upper lid Dermatochalasis - upper lid   Conjunctiva/Sclera White and quiet White and quiet   Cornea Mild Arcus Mild Arcus   Anterior Chamber Deep and quiet Deep and quiet   Iris Round and well dilated Round and well dilated   Lens 1+ Nuclear sclerosis, 2+ Cortical cataract 1+ Nuclear sclerosis, 2+ Cortical cataract   Vitreous Vitreous syneresis, no tobacco dust, Posterior vitreous detachment Mild Vitreous syneresis       Fundus Exam      Right Left   Disc Pink and Sharp Pink and Sharp   C/D Ratio 0.4 0.5   Macula Good foveal reflex, No heme or edema Good foveal reflex, No heme or edema   Vessels Normal Normal   Periphery Attached, Horseshoe tear at 1030 with bridging vessel with cuff of SRF-- good laser 360 around tear and SRF but thinner barrier along superior border, mild pigmented lattice degeneration inferiorly Attached          IMAGING AND PROCEDURES  Imaging and Procedures for @TODAY @  OCT, Retina - OU - Both Eyes       Right Eye Quality was good. Central Foveal Thickness: 286. Progression has been stable. Findings include normal foveal contour, no IRF, no SRF.   Left Eye Quality was good. Central Foveal Thickness: 282. Progression has been stable. Findings include normal foveal contour, no IRF, no SRF.   Notes *Images captured and stored on drive  Diagnosis / Impression:  NFP, No IRF/SRF OU  Clinical management:  See below  Abbreviations: NFP - Normal foveal profile. CME - cystoid macular edema. PED - pigment epithelial detachment. IRF - intraretinal fluid. SRF - subretinal fluid. EZ - ellipsoid zone. ERM - epiretinal membrane. ORA - outer retinal atrophy. ORT - outer retinal tubulation. SRHM - subretinal hyper-reflective material          LASER PROCEDURE NOTE  Procedure:     TOUCH UP Barrier laser retinopexy using slit lamp laser, RIGHT eye  Diagnosis:      Retinal tear  with focal retinal detachment, RIGHT eye             Horseshoe tear at 1030 o'clock anterior to equator  Surgeon: Rennis Chris, MD, PhD  Anesthesia: Topical  Informed consent obtained, operative eye marked, and time out performed prior to initiation  of laser.   Laser settings:  Lumenis Smart532 laser, slit lamp Lens: [place] PRP 165 Power: 250 mW Spot size: 200 microns Duration: 40 msec  # spots: 139  Placement of laser: Using a [place] PRP 165 contact lens at the slit lamp, touch up laser was placed supplement the superior border of break a 1030 oclock anterior to equator.  Complications: None.  Patient tolerated the procedure well and received written and verbal post-procedure care information/education.          ASSESSMENT/PLAN:    ICD-10-CM   1. 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 Retinal detachment of right eye with single break H33.011   3. Posterior vitreous detachment of right eye H43.811   4. Retinal edema H35.81 OCT, Retina - OU - Both Eyes  5. Combined forms of age-related cataract of both eyes H25.813     1,2. Horseshoe retinal tear w/ focal retinal detachment, OD  - horseshoe tear at 1030 with bridging vessel and surrounding SRF - S/P laser retinopexy OD (06.07.19) -- good surrounding laser in place, but superior barricade a little thinner than the remainder of laser - no new tears or RD - recommend touch up laser retinopexy - RBA of procedure discussed, questions answered - informed consent obtained and signed - see procedure note above - restart PF QID OD x7 days - f/u 2-3 wks  3. PVD / vitreous syneresis OS  Discussed findings and prognosis  No other RT or RD on 360 peripheral exam OS  Reviewed s/s of RT/RD  Strict return precaution for any such RT/RD signs/symptoms  4. No retinal edema on exam or OCT  5. Combined form age-related cataract OU-  - The symptoms of cataract, surgical options,  and treatments and risks were discussed with patient. - discussed diagnosis and progression - not yet visually significant - monitor for now    Ophthalmic Meds Ordered this visit:  No orders of the defined types were placed in this encounter.      Return in about 2 weeks (around 07/13/2018) for Dilated Exam, OCT, POV.  There are no Patient Instructions on file for this visit.   Explained the diagnoses, plan, and follow up with the patient and they expressed understanding.  Patient expressed understanding of the importance of proper follow up care.    This document serves as a record of services personally performed by Karie Chimera, MD, PhD. It was created on their behalf by Laurian Brim, OA, an ophthalmic assistant. The creation of this record is the provider's dictation and/or activities during the visit.    Electronically signed by: Laurian Brim, OA  09.26.19 12:58 AM    Karie Chimera, M.D., Ph.D. Diseases & Surgery of the Retina and Vitreous [facility]   I have reviewed the above documentation for accuracy and completeness, and I agree with the above. Karie Chimera, M.D., Ph.D. 07/02/18 1:05 AM     Abbreviations: M myopia (nearsighted); A astigmatism; H hyperopia (farsighted); P presbyopia; Mrx spectacle prescription;  CTL contact lenses; OD right eye; OS left eye; OU both eyes  XT exotropia; ET esotropia; PEK punctate epithelial keratitis; PEE punctate epithelial erosions; DES dry eye syndrome; MGD meibomian gland dysfunction; ATs artificial tears; PFAT's preservative free artificial tears; NSC nuclear sclerotic cataract; PSC posterior subcapsular cataract; ERM epi-retinal membrane; PVD posterior vitreous detachment; RD retinal detachment; DM diabetes mellitus; DR diabetic retinopathy; NPDR non-proliferative diabetic retinopathy;  PDR proliferative diabetic retinopathy; CSME clinically significant macular edema; DME diabetic macular edema; dbh  dot blot hemorrhages; CWS cotton wool spot; POAG primary open angle glaucoma; C/D cup-to-disc ratio; HVF humphrey visual field; GVF goldmann visual field; OCT optical coherence tomography; IOP intraocular pressure; BRVO Branch retinal vein occlusion; CRVO central retinal vein occlusion; CRAO central retinal artery occlusion; BRAO branch retinal artery occlusion; RT retinal tear; SB scleral buckle; PPV pars plana vitrectomy; VH Vitreous hemorrhage; PRP panretinal laser photocoagulation; IVK intravitreal kenalog; VMT vitreomacular traction; MH Macular hole;  NVD neovascularization of the disc; NVE neovascularization elsewhere; AREDS age related eye disease study; ARMD age related macular degeneration; POAG primary open angle glaucoma; EBMD epithelial/anterior basement membrane dystrophy; ACIOL anterior chamber intraocular lens; IOL intraocular lens; PCIOL posterior chamber intraocular lens; Phaco/IOL phacoemulsification with intraocular lens placement; PRK photorefractive keratectomy; LASIK laser assisted in situ keratomileusis; HTN hypertension; DM diabetes mellitus; COPD chronic obstructive pulmonary disease

## 2018-06-29 ENCOUNTER — Encounter (INDEPENDENT_AMBULATORY_CARE_PROVIDER_SITE_OTHER): Payer: Self-pay | Admitting: Ophthalmology

## 2018-06-29 ENCOUNTER — Ambulatory Visit (INDEPENDENT_AMBULATORY_CARE_PROVIDER_SITE_OTHER): Payer: Commercial Managed Care - PPO | Admitting: Ophthalmology

## 2018-06-29 DIAGNOSIS — H33311 Horseshoe tear of retina without detachment, right eye: Secondary | ICD-10-CM | POA: Diagnosis not present

## 2018-06-29 DIAGNOSIS — H25813 Combined forms of age-related cataract, bilateral: Secondary | ICD-10-CM

## 2018-06-29 DIAGNOSIS — H3581 Retinal edema: Secondary | ICD-10-CM

## 2018-06-29 DIAGNOSIS — H43811 Vitreous degeneration, right eye: Secondary | ICD-10-CM | POA: Diagnosis not present

## 2018-06-29 DIAGNOSIS — H33011 Retinal detachment with single break, right eye: Secondary | ICD-10-CM | POA: Diagnosis not present

## 2018-07-02 ENCOUNTER — Encounter (INDEPENDENT_AMBULATORY_CARE_PROVIDER_SITE_OTHER): Payer: Self-pay | Admitting: Ophthalmology

## 2018-07-16 DIAGNOSIS — G4733 Obstructive sleep apnea (adult) (pediatric): Secondary | ICD-10-CM | POA: Diagnosis not present

## 2018-07-27 ENCOUNTER — Encounter (INDEPENDENT_AMBULATORY_CARE_PROVIDER_SITE_OTHER): Payer: Commercial Managed Care - PPO | Admitting: Ophthalmology

## 2018-08-01 NOTE — Progress Notes (Signed)
[facility] - Clinic Note  08/03/2018     CHIEF COMPLAINT Patient presents for Retina Follow Up   HISTORY OF PRESENT ILLNESS: Stacey Mullins is a 57 y.o. female who presents to the clinic today for:   HPI    Retina Follow Up    In right eye.  Duration of 1 month.  Since onset it is gradually improving.  I, the attending physician,  performed the HPI with the patient and updated documentation appropriately.          Comments    S/P laser retinopexy OD (03/09/2018), touch up laser (06/29/2018). Patient states vision fluctuates OD. Sometimes vision "wavy" in inferior vision where laser was done OD. Patient denies new floaters/FOL.       Last edited by Rennis Chris, MD on 08/03/2018  3:48 PM. (History)    pt states everything has been the same since last visit, denies new flashes/floaters, she has occasional pain superiorly OS  Referring physician: Laurann Montana, MD [phone] [address] [place], [place] [address]  HISTORICAL INFORMATION:   Selected notes from the MEDICAL RECORD NUMBER Referred by Dr. Celene Skeen for concern of RT and PVD OD;  LEE- 06.07.19 (N. Patel) [BCVA OD: 20/25+1 OS: 20/20-1] Ocular Hx- vitreous degen OD, horseshoe tear OD, DES OU, vitreous opacities OS, catarac OU PMH- HTN, thyroid disease, CKD    CURRENT MEDICATIONS: No current outpatient medications on file. (Ophthalmic Drugs)   No current facility-administered medications for this visit.  (Ophthalmic Drugs)   Current Outpatient Medications (Other)  Medication Sig  . allopurinol (ZYLOPRIM) 300 MG tablet Take 300 mg by mouth daily.  Marland Kitchen amLODipine (NORVASC) 5 MG tablet Take 5 mg by mouth daily.  . Cholecalciferol (VITAMIN D3) 2000 UNITS capsule Take 4,000 Units by mouth daily.   Marland Kitchen levothyroxine (SYNTHROID, LEVOTHROID) 150 MCG tablet Take 150 mcg by mouth daily before breakfast.  . losartan (COZAAR) 100 MG tablet Take 100 mg by mouth daily.  . metoprolol tartrate (LOPRESSOR) 25  MG tablet Take 25 mg by mouth daily.  . Multiple Vitamin (MULTIVITAMIN) capsule Take 1 capsule by mouth daily.  Marland Kitchen spironolactone (ALDACTONE) 25 MG tablet    No current facility-administered medications for this visit.  (Other)      REVIEW OF SYSTEMS: ROS    Positive for: Endocrine, Cardiovascular, Eyes   Negative for: Constitutional, Gastrointestinal, Neurological, Skin, Genitourinary, Musculoskeletal, HENT, Respiratory, Psychiatric, Allergic/Imm, Heme/Lymph   Last edited by Annalee Genta D on 08/03/2018  1:55 PM. (History)       ALLERGIES Allergies  Allergen Reactions  . Benicar [Olmesartan]     Coughing   . Erythromycin     Thrush    PAST MEDICAL HISTORY Past Medical History:  Diagnosis Date  . Chronic kidney disease    proteinuria  . Gout   . Hypertension   . Hypothyroid   . Thyroid disease    Past Surgical History:  Procedure Laterality Date  . RENAL BIOPSY     NGA nephropathy    FAMILY HISTORY Family History  Problem Relation Age of Onset  . Arthritis/Rheumatoid Mother   . Thyroid disease Mother   . Factor V Leiden deficiency Mother   . Hypertension Father     SOCIAL HISTORY Social History   Tobacco Use  . Smoking status: Never Smoker  . Smokeless tobacco: Never Used  Substance Use Topics  . Alcohol use: No  . Drug use: No  OPHTHALMIC EXAM:  Base Eye Exam    Visual Acuity (Snellen - Linear)      Right Left   Dist [place] 20/20 -1 20/20 -1       Tonometry (Tonopen, 2:04 PM)      Right Left   Pressure 19 15       Pupils      Dark Light Shape React APD   Right 4 3 Round Slow None   Left 4 3 Round Slow None       Visual Fields (Counting fingers)      Left Right    Full Full       Extraocular Movement      Right Left    Full, Ortho Full, Ortho       Neuro/Psych    Oriented x3:  Yes   Mood/Affect:  Normal       Dilation    Both eyes:  1.0% Mydriacyl, 2.5% Phenylephrine @ 2:05 PM        Slit Lamp and Fundus Exam     Slit Lamp Exam      Right Left   Lids/Lashes Dermatochalasis - upper lid Dermatochalasis - upper lid   Conjunctiva/Sclera White and quiet White and quiet   Cornea Mild Arcus Mild Arcus   Anterior Chamber Deep and quiet Deep and quiet   Iris Round and well dilated Round and well dilated   Lens 1+ Nuclear sclerosis, 2+ Cortical cataract 1+ Nuclear sclerosis, 2+ Cortical cataract   Vitreous Vitreous syneresis, no tobacco dust, Posterior vitreous detachment Mild Vitreous syneresis       Fundus Exam      Right Left   Disc Pink and Sharp Pink and Sharp   C/D Ratio 0.4 0.5   Macula Good foveal reflex, No heme or edema Good foveal reflex, No heme or edema   Vessels Normal Normal   Periphery Attached, Horseshoe tear at 1030 with bridging vessel with cuff of SRF resolved-- good laser 360 around tear, mild pigmented lattice degeneration inferiorly Attached          IMAGING AND PROCEDURES  Imaging and Procedures for @TODAY @  OCT, Retina - OU - Both Eyes       Right Eye Quality was good. Central Foveal Thickness: 294. Progression has been stable. Findings include normal foveal contour, no IRF, no SRF.   Left Eye Quality was good. Central Foveal Thickness: 279. Progression has been stable. Findings include normal foveal contour, no IRF, no SRF, vitreomacular adhesion .   Notes *Images captured and stored on drive  Diagnosis / Impression:  NFP, No IRF/SRF OU  Clinical management:  See below  Abbreviations: NFP - Normal foveal profile. CME - cystoid macular edema. PED - pigment epithelial detachment. IRF - intraretinal fluid. SRF - subretinal fluid. EZ - ellipsoid zone. ERM - epiretinal membrane. ORA - outer retinal atrophy. ORT - outer retinal tubulation. SRHM - subretinal hyper-reflective material                ASSESSMENT/PLAN:    ICD-10-CM   1. 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 Retinal detachment of right eye with single break H33.011   3. Posterior  vitreous detachment of right eye H43.811   4. Retinal edema H35.81 OCT, Retina - OU - Both Eyes  5. Combined forms of age-related cataract of both eyes H25.813     1,2. Horseshoe retinal tear w/ focal retinal detachment, OD  - horseshoe tear at 1030 with  bridging vessel and surrounding SRF resolved - S/P laser retinopexy OD (06.07.19), touch up laser (09.27.19) - no new tears or RD - f/u 3 months-- call if symptoms change  3. PVD / vitreous syneresis OS  Discussed findings and prognosis  No other RT or RD on 360 peripheral exam OS  Reviewed s/s of RT/RD  Strict return precaution for any such RT/RD signs/symptoms  4. No retinal edema on exam or OCT  5. Combined form age-related cataract OU-  - The symptoms of cataract, surgical options, and treatments and risks were discussed with patient. - discussed diagnosis and progression - not yet visually significant - monitor for now    Ophthalmic Meds Ordered this visit:  No orders of the defined types were placed in this encounter.      Return in about 3 months (around 11/03/2018).  There are no Patient Instructions on file for this visit.   Explained the diagnoses, plan, and follow up with the patient and they expressed understanding.  Patient expressed understanding of the importance of proper follow up care.    This document serves as a record of services personally performed by Karie Chimera, MD, PhD. It was created on their behalf by Laurian Brim, OA, an ophthalmic assistant. The creation of this record is the provider's dictation and/or activities during the visit.    Electronically signed by: Laurian Brim, OA 10.30.19 11:12 PM    Karie Chimera, M.D., Ph.D. Diseases & Surgery of the Retina and Vitreous [facility]   I have reviewed the above documentation for accuracy and completeness, and I agree with the above. Karie Chimera, M.D., Ph.D. 08/05/18 11:14 PM    Abbreviations: M myopia  (nearsighted); A astigmatism; H hyperopia (farsighted); P presbyopia; Mrx spectacle prescription;  CTL contact lenses; OD right eye; OS left eye; OU both eyes  XT exotropia; ET esotropia; PEK punctate epithelial keratitis; PEE punctate epithelial erosions; DES dry eye syndrome; MGD meibomian gland dysfunction; ATs artificial tears; PFAT's preservative free artificial tears; NSC nuclear sclerotic cataract; PSC posterior subcapsular cataract; ERM epi-retinal membrane; PVD posterior vitreous detachment; RD retinal detachment; DM diabetes mellitus; DR diabetic retinopathy; NPDR non-proliferative diabetic retinopathy; PDR proliferative diabetic retinopathy; CSME clinically significant macular edema; DME diabetic macular edema; dbh dot blot hemorrhages; CWS cotton wool spot; POAG primary open angle glaucoma; C/D cup-to-disc ratio; HVF humphrey visual field; GVF goldmann visual field; OCT optical coherence tomography; IOP intraocular pressure; BRVO Branch retinal vein occlusion; CRVO central retinal vein occlusion; CRAO central retinal artery occlusion; BRAO branch retinal artery occlusion; RT retinal tear; SB scleral buckle; PPV pars plana vitrectomy; VH Vitreous hemorrhage; PRP panretinal laser photocoagulation; IVK intravitreal kenalog; VMT vitreomacular traction; MH Macular hole;  NVD neovascularization of the disc; NVE neovascularization elsewhere; AREDS age related eye disease study; ARMD age related macular degeneration; POAG primary open angle glaucoma; EBMD epithelial/anterior basement membrane dystrophy; ACIOL anterior chamber intraocular lens; IOL intraocular lens; PCIOL posterior chamber intraocular lens; Phaco/IOL phacoemulsification with intraocular lens placement; PRK photorefractive keratectomy; LASIK laser assisted in situ keratomileusis; HTN hypertension; DM diabetes mellitus; COPD chronic obstructive pulmonary disease

## 2018-08-03 ENCOUNTER — Encounter (INDEPENDENT_AMBULATORY_CARE_PROVIDER_SITE_OTHER): Payer: Self-pay | Admitting: Ophthalmology

## 2018-08-03 ENCOUNTER — Ambulatory Visit (INDEPENDENT_AMBULATORY_CARE_PROVIDER_SITE_OTHER): Payer: Commercial Managed Care - PPO | Admitting: Ophthalmology

## 2018-08-03 DIAGNOSIS — H43811 Vitreous degeneration, right eye: Secondary | ICD-10-CM

## 2018-08-03 DIAGNOSIS — H3581 Retinal edema: Secondary | ICD-10-CM

## 2018-08-03 DIAGNOSIS — H25813 Combined forms of age-related cataract, bilateral: Secondary | ICD-10-CM

## 2018-08-03 DIAGNOSIS — H33011 Retinal detachment with single break, right eye: Secondary | ICD-10-CM

## 2018-08-03 DIAGNOSIS — H33311 Horseshoe tear of retina without detachment, right eye: Secondary | ICD-10-CM

## 2018-08-05 ENCOUNTER — Encounter (INDEPENDENT_AMBULATORY_CARE_PROVIDER_SITE_OTHER): Payer: Self-pay | Admitting: Ophthalmology

## 2018-08-14 DIAGNOSIS — N183 Chronic kidney disease, stage 3 (moderate): Secondary | ICD-10-CM | POA: Diagnosis not present

## 2018-08-18 DIAGNOSIS — H33311 Horseshoe tear of retina without detachment, right eye: Secondary | ICD-10-CM | POA: Diagnosis not present

## 2018-08-18 DIAGNOSIS — H3561 Retinal hemorrhage, right eye: Secondary | ICD-10-CM | POA: Diagnosis not present

## 2018-08-21 DIAGNOSIS — N028 Recurrent and persistent hematuria with other morphologic changes: Secondary | ICD-10-CM | POA: Diagnosis not present

## 2018-08-21 DIAGNOSIS — I10 Essential (primary) hypertension: Secondary | ICD-10-CM | POA: Diagnosis not present

## 2018-08-21 DIAGNOSIS — N183 Chronic kidney disease, stage 3 (moderate): Secondary | ICD-10-CM | POA: Diagnosis not present

## 2018-08-27 ENCOUNTER — Encounter (INDEPENDENT_AMBULATORY_CARE_PROVIDER_SITE_OTHER): Payer: Commercial Managed Care - PPO | Admitting: Ophthalmology

## 2018-09-07 ENCOUNTER — Ambulatory Visit (INDEPENDENT_AMBULATORY_CARE_PROVIDER_SITE_OTHER): Payer: Commercial Managed Care - PPO | Admitting: Ophthalmology

## 2018-09-07 ENCOUNTER — Encounter (INDEPENDENT_AMBULATORY_CARE_PROVIDER_SITE_OTHER): Payer: Self-pay | Admitting: Ophthalmology

## 2018-09-07 DIAGNOSIS — H25813 Combined forms of age-related cataract, bilateral: Secondary | ICD-10-CM

## 2018-09-07 DIAGNOSIS — H33311 Horseshoe tear of retina without detachment, right eye: Secondary | ICD-10-CM

## 2018-09-07 DIAGNOSIS — H3581 Retinal edema: Secondary | ICD-10-CM

## 2018-09-07 DIAGNOSIS — H43811 Vitreous degeneration, right eye: Secondary | ICD-10-CM | POA: Diagnosis not present

## 2018-09-07 DIAGNOSIS — H33011 Retinal detachment with single break, right eye: Secondary | ICD-10-CM

## 2018-09-07 NOTE — Progress Notes (Signed)
[facility] - Clinic Note  09/07/2018     CHIEF COMPLAINT Patient presents for Retina Follow Up   HISTORY OF PRESENT ILLNESS: Stacey Mullins is a 57 y.o. female who presents to the clinic today for:   HPI    Retina Follow Up    Patient presents with  Retinal Break/Detachment.  In right eye.  This started 3 weeks ago.  Severity is moderate.  Duration of 3 weeks.  Since onset it is gradually improving.  I, the attending physician,  performed the HPI with the patient and updated documentation appropriately.          Comments    F/U from black spot in OD-saw Dr. Luciana Axe same day on 08/18/2018. Saw big floater OD at first then broke down into smaller pieces (squiggles) in vision. Opacities in vision improving OD but overall vision still foggy OD.       Last edited by Rennis Chris, MD on 09/07/2018  3:57 PM. (History)    pt saw Dr. Luciana Axe over the holiday, pt states she saw a black spot OD that looked like mascara on her eye, she states it then floated upwards and then went everywhere, she states by the time she saw Dr. Luciana Axe it had gotten a little bit better, she states they looked like squiggles, pt states her vision was 20/20 when she saw [facility]  Referring physician: Laurann Montana, MD [phone] [address] [place], [place] [address]  HISTORICAL INFORMATION:   Selected notes from the MEDICAL RECORD NUMBER Referred by Dr. Celene Skeen for concern of RT and PVD OD;  LEE- 06.07.19 (N. Patel) [BCVA OD: 20/25+1 OS: 20/20-1] Ocular Hx- vitreous degen OD, horseshoe tear OD, DES OU, vitreous opacities OS, catarac OU PMH- HTN, thyroid disease, CKD    CURRENT MEDICATIONS: No current outpatient medications on file. (Ophthalmic Drugs)   No current facility-administered medications for this visit.  (Ophthalmic Drugs)   Current Outpatient Medications (Other)  Medication Sig  . allopurinol (ZYLOPRIM) 300 MG tablet Take 300 mg by mouth daily.  Marland Kitchen amLODipine (NORVASC) 5  MG tablet Take 5 mg by mouth daily.  . Cholecalciferol (VITAMIN D3) 2000 UNITS capsule Take 4,000 Units by mouth daily.   Marland Kitchen levothyroxine (SYNTHROID, LEVOTHROID) 150 MCG tablet Take 150 mcg by mouth daily before breakfast.  . losartan (COZAAR) 100 MG tablet Take 100 mg by mouth daily.  . metoprolol tartrate (LOPRESSOR) 25 MG tablet Take 25 mg by mouth daily.  . Multiple Vitamin (MULTIVITAMIN) capsule Take 1 capsule by mouth daily.  Marland Kitchen spironolactone (ALDACTONE) 25 MG tablet    No current facility-administered medications for this visit.  (Other)      REVIEW OF SYSTEMS: ROS    Positive for: Endocrine, Cardiovascular, Eyes   Negative for: Constitutional, Gastrointestinal, Neurological, Skin, Genitourinary, Musculoskeletal, HENT, Respiratory, Psychiatric, Allergic/Imm, Heme/Lymph   Last edited by Annalee Genta D on 09/07/2018  2:37 PM. (History)       ALLERGIES Allergies  Allergen Reactions  . Benicar [Olmesartan]     Coughing   . Erythromycin     Thrush    PAST MEDICAL HISTORY Past Medical History:  Diagnosis Date  . Chronic kidney disease    proteinuria  . Gout   . Hypertension   . Hypothyroid   . Thyroid disease    Past Surgical History:  Procedure Laterality Date  . RENAL BIOPSY     NGA nephropathy    FAMILY HISTORY Family History  Problem  Relation Age of Onset  . Arthritis/Rheumatoid Mother   . Thyroid disease Mother   . Factor V Leiden deficiency Mother   . Hypertension Father     SOCIAL HISTORY Social History   Tobacco Use  . Smoking status: Never Smoker  . Smokeless tobacco: Never Used  Substance Use Topics  . Alcohol use: No  . Drug use: No         OPHTHALMIC EXAM:  Base Eye Exam    Visual Acuity (Snellen - Linear)      Right Left   Dist [place] 20/20 20/20 -1       Tonometry (Tonopen, 2:43 PM)      Right Left   Pressure 15 14       Pupils      Dark Light Shape React APD   Right 4 3 Round Brisk None   Left 4 3 Round Brisk None        Visual Fields (Counting fingers)      Left Right    Full Full       Extraocular Movement      Right Left    Full, Ortho Full, Ortho       Neuro/Psych    Oriented x3:  Yes   Mood/Affect:  Normal       Dilation    Both eyes:  1.0% Mydriacyl, 2.5% Phenylephrine @ 2:43 PM        Slit Lamp and Fundus Exam    Slit Lamp Exam      Right Left   Lids/Lashes Dermatochalasis - upper lid Dermatochalasis - upper lid   Conjunctiva/Sclera White and quiet White and quiet   Cornea Mild Arcus Mild Arcus   Anterior Chamber Deep and quiet Deep and quiet   Iris Round and well dilated Round and well dilated   Lens 1+ Nuclear sclerosis, 2+ Cortical cataract 1+ Nuclear sclerosis, 2+ Cortical cataract   Vitreous Vitreous syneresis, +RBC anterior vitreous, Posterior vitreous detachment Mild Vitreous syneresis       Fundus Exam      Right Left   Disc Pink and Sharp Pink and Sharp   C/D Ratio 0.5 0.55   Macula Good foveal reflex, No heme or edema Flat, Good foveal reflex, No heme or edema   Vessels Normal Normal   Periphery Attached, Horseshoe tear at 1030 with bridging vessel with cuff of SRF resolved-- bridging vessel no longer present, mild IRH along temporal border, good laser 360 around tear, mild pigmented lattice degeneration inferiorly, trace pre-retinal hemorrhage inferiorly Attached          IMAGING AND PROCEDURES  Imaging and Procedures for @TODAY @  OCT, Retina - OU - Both Eyes       Right Eye Quality was good. Central Foveal Thickness: 286. Progression has been stable. Findings include normal foveal contour, no IRF, no SRF.   Left Eye Quality was good. Central Foveal Thickness: 281. Progression has been stable. Findings include normal foveal contour, no IRF, no SRF, vitreomacular adhesion .   Notes *Images captured and stored on drive  Diagnosis / Impression:  NFP, No IRF/SRF OU  Clinical management:  See below  Abbreviations: NFP - Normal foveal profile. CME -  cystoid macular edema. PED - pigment epithelial detachment. IRF - intraretinal fluid. SRF - subretinal fluid. EZ - ellipsoid zone. ERM - epiretinal membrane. ORA - outer retinal atrophy. ORT - outer retinal tubulation. SRHM - subretinal hyper-reflective material  ASSESSMENT/PLAN:    ICD-10-CM   1. 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 Retinal detachment of right eye with single break H33.011   3. Posterior vitreous detachment of right eye H43.811   4. Retinal edema H35.81 OCT, Retina - OU - Both Eyes  5. Combined forms of age-related cataract of both eyes H25.813     1,2. Horseshoe retinal tear w/ focal retinal detachment, OD  - horseshoe tear at 1030 with bridging vessel and surrounding SRF resolved -- bridging vessel no longer present - S/P laser retinopexy OD (06.07.19), touch up laser (09.27.19) - presents today in f/u for acute floaters OD -- was seen by Dr. Luciana Axeankin on 11.16.19 - today, symptoms improved and exam shows bridging vessel no longer present - suspect symptoms related to tearing of bridging vessel and new preretinal and vitreous heme OD -- which appear to be resolving - no new tears or RD - f/u as scheduled -- February appt, PRN sooner  3. PVD / vitreous syneresis OS  Discussed findings and prognosis  No other RT or RD on 360 peripheral exam OS  Reviewed s/s of RT/RD  Strict return precaution for any such RT/RD signs/symptoms  4. No retinal edema on exam or OCT  5. Combined form age-related cataract OU-  - The symptoms of cataract, surgical options, and treatments and risks were discussed with patient. - discussed diagnosis and progression - not yet visually significant - monitor for now   Ophthalmic Meds Ordered this visit:  No orders of the defined types were placed in this encounter.      Return for keep scheduled appt for February, PRN sooner.  There are no Patient Instructions on file for this visit.   Explained the  diagnoses, plan, and follow up with the patient and they expressed understanding.  Patient expressed understanding of the importance of proper follow up care.    This document serves as a record of services personally performed by Karie ChimeraBrian G. Asiyah Pineau, MD, PhD. It was created on their behalf by Laurian BrimAmanda Brown, OA, an ophthalmic assistant. The creation of this record is the provider's dictation and/or activities during the visit.    Electronically signed by: Laurian BrimAmanda Brown, OA  12.06.19 4:23 PM    Karie ChimeraBrian G. Jacilyn Sanpedro, M.D., Ph.D. Diseases & Surgery of the Retina and Vitreous [facility]  I have reviewed the above documentation for accuracy and completeness, and I agree with the above. Karie ChimeraBrian G. Jerrick Farve, M.D., Ph.D. 09/09/18 4:27 PM   Abbreviations: M myopia (nearsighted); A astigmatism; H hyperopia (farsighted); P presbyopia; Mrx spectacle prescription;  CTL contact lenses; OD right eye; OS left eye; OU both eyes  XT exotropia; ET esotropia; PEK punctate epithelial keratitis; PEE punctate epithelial erosions; DES dry eye syndrome; MGD meibomian gland dysfunction; ATs artificial tears; PFAT's preservative free artificial tears; NSC nuclear sclerotic cataract; PSC posterior subcapsular cataract; ERM epi-retinal membrane; PVD posterior vitreous detachment; RD retinal detachment; DM diabetes mellitus; DR diabetic retinopathy; NPDR non-proliferative diabetic retinopathy; PDR proliferative diabetic retinopathy; CSME clinically significant macular edema; DME diabetic macular edema; dbh dot blot hemorrhages; CWS cotton wool spot; POAG primary open angle glaucoma; C/D cup-to-disc ratio; HVF humphrey visual field; GVF goldmann visual field; OCT optical coherence tomography; IOP intraocular pressure; BRVO Branch retinal vein occlusion; CRVO central retinal vein occlusion; CRAO central retinal artery occlusion; BRAO branch retinal artery occlusion; RT retinal tear; SB scleral buckle; PPV pars plana  vitrectomy; VH Vitreous hemorrhage; PRP panretinal laser photocoagulation;  IVK intravitreal kenalog; VMT vitreomacular traction; MH Macular hole;  NVD neovascularization of the disc; NVE neovascularization elsewhere; AREDS age related eye disease study; ARMD age related macular degeneration; POAG primary open angle glaucoma; EBMD epithelial/anterior basement membrane dystrophy; ACIOL anterior chamber intraocular lens; IOL intraocular lens; PCIOL posterior chamber intraocular lens; Phaco/IOL phacoemulsification with intraocular lens placement; [place] photorefractive keratectomy; LASIK laser assisted in situ keratomileusis; HTN hypertension; DM diabetes mellitus; COPD chronic obstructive pulmonary disease

## 2018-09-09 ENCOUNTER — Encounter (INDEPENDENT_AMBULATORY_CARE_PROVIDER_SITE_OTHER): Payer: Self-pay | Admitting: Ophthalmology

## 2018-11-09 ENCOUNTER — Encounter (INDEPENDENT_AMBULATORY_CARE_PROVIDER_SITE_OTHER): Payer: Commercial Managed Care - PPO | Admitting: Ophthalmology

## 2018-11-19 DIAGNOSIS — N183 Chronic kidney disease, stage 3 (moderate): Secondary | ICD-10-CM | POA: Diagnosis not present

## 2018-11-27 DIAGNOSIS — H33311 Horseshoe tear of retina without detachment, right eye: Secondary | ICD-10-CM | POA: Diagnosis not present

## 2018-11-27 DIAGNOSIS — N183 Chronic kidney disease, stage 3 (moderate): Secondary | ICD-10-CM | POA: Diagnosis not present

## 2018-11-27 DIAGNOSIS — H3561 Retinal hemorrhage, right eye: Secondary | ICD-10-CM | POA: Diagnosis not present

## 2018-11-27 DIAGNOSIS — H43811 Vitreous degeneration, right eye: Secondary | ICD-10-CM | POA: Diagnosis not present

## 2018-12-13 DIAGNOSIS — E039 Hypothyroidism, unspecified: Secondary | ICD-10-CM | POA: Diagnosis not present

## 2018-12-13 DIAGNOSIS — N183 Chronic kidney disease, stage 3 (moderate): Secondary | ICD-10-CM | POA: Diagnosis not present

## 2018-12-13 DIAGNOSIS — I129 Hypertensive chronic kidney disease with stage 1 through stage 4 chronic kidney disease, or unspecified chronic kidney disease: Secondary | ICD-10-CM | POA: Diagnosis not present

## 2019-01-08 DIAGNOSIS — H33311 Horseshoe tear of retina without detachment, right eye: Secondary | ICD-10-CM | POA: Diagnosis not present

## 2019-01-08 DIAGNOSIS — H3561 Retinal hemorrhage, right eye: Secondary | ICD-10-CM | POA: Diagnosis not present

## 2019-01-08 DIAGNOSIS — H43811 Vitreous degeneration, right eye: Secondary | ICD-10-CM | POA: Diagnosis not present

## 2019-06-25 ENCOUNTER — Other Ambulatory Visit: Payer: Self-pay | Admitting: Family Medicine

## 2019-06-25 DIAGNOSIS — Z1231 Encounter for screening mammogram for malignant neoplasm of breast: Secondary | ICD-10-CM

## 2019-06-28 ENCOUNTER — Other Ambulatory Visit: Payer: Self-pay | Admitting: Family Medicine

## 2019-06-28 DIAGNOSIS — R140 Abdominal distension (gaseous): Secondary | ICD-10-CM

## 2019-07-03 ENCOUNTER — Ambulatory Visit
Admission: RE | Admit: 2019-07-03 | Discharge: 2019-07-03 | Disposition: A | Discharge status: Home / Self Care | Payer: Commercial Managed Care - PPO | Source: Ambulatory Visit | Attending: Family Medicine | Admitting: Family Medicine

## 2019-07-03 DIAGNOSIS — R140 Abdominal distension (gaseous): Secondary | ICD-10-CM

## 2019-08-09 ENCOUNTER — Ambulatory Visit: Payer: Commercial Managed Care - PPO

## 2019-09-02 ENCOUNTER — Other Ambulatory Visit: Payer: Self-pay

## 2019-09-02 ENCOUNTER — Ambulatory Visit
Admission: RE | Admit: 2019-09-02 | Discharge: 2019-09-02 | Disposition: A | Discharge status: Home / Self Care | Payer: Commercial Managed Care - PPO | Source: Ambulatory Visit | Attending: Family Medicine | Admitting: Family Medicine

## 2019-09-02 DIAGNOSIS — Z1231 Encounter for screening mammogram for malignant neoplasm of breast: Secondary | ICD-10-CM

## 2020-06-25 ENCOUNTER — Other Ambulatory Visit: Payer: Self-pay | Admitting: Family Medicine

## 2020-06-25 DIAGNOSIS — Z1231 Encounter for screening mammogram for malignant neoplasm of breast: Secondary | ICD-10-CM

## 2020-09-02 ENCOUNTER — Ambulatory Visit
Admission: RE | Admit: 2020-09-02 | Discharge: 2020-09-02 | Disposition: A | Discharge status: Home / Self Care | Payer: Commercial Managed Care - PPO | Source: Ambulatory Visit | Attending: Family Medicine | Admitting: Family Medicine

## 2020-09-02 ENCOUNTER — Other Ambulatory Visit: Payer: Self-pay

## 2020-09-02 DIAGNOSIS — Z1231 Encounter for screening mammogram for malignant neoplasm of breast: Secondary | ICD-10-CM

## 2020-09-03 ENCOUNTER — Ambulatory Visit: Payer: Commercial Managed Care - PPO

## 2020-12-10 ENCOUNTER — Encounter (INDEPENDENT_AMBULATORY_CARE_PROVIDER_SITE_OTHER): Payer: Commercial Managed Care - PPO | Admitting: Ophthalmology

## 2021-09-30 ENCOUNTER — Other Ambulatory Visit: Payer: Self-pay | Admitting: Family Medicine

## 2021-09-30 DIAGNOSIS — Z1231 Encounter for screening mammogram for malignant neoplasm of breast: Secondary | ICD-10-CM

## 2021-10-20 ENCOUNTER — Ambulatory Visit
Admission: RE | Admit: 2021-10-20 | Discharge: 2021-10-20 | Disposition: A | Discharge status: Home / Self Care | Payer: Commercial Managed Care - PPO | Source: Ambulatory Visit | Attending: Family Medicine | Admitting: Family Medicine

## 2021-10-20 DIAGNOSIS — Z1231 Encounter for screening mammogram for malignant neoplasm of breast: Secondary | ICD-10-CM

## 2021-10-21 ENCOUNTER — Other Ambulatory Visit: Payer: Self-pay | Admitting: Family Medicine

## 2021-10-21 DIAGNOSIS — R928 Other abnormal and inconclusive findings on diagnostic imaging of breast: Secondary | ICD-10-CM

## 2021-11-11 ENCOUNTER — Ambulatory Visit
Admission: RE | Admit: 2021-11-11 | Discharge: 2021-11-11 | Disposition: A | Discharge status: Home / Self Care | Payer: Commercial Managed Care - PPO | Source: Ambulatory Visit | Attending: Family Medicine | Admitting: Family Medicine

## 2021-11-11 ENCOUNTER — Other Ambulatory Visit: Payer: Self-pay

## 2021-11-11 DIAGNOSIS — R928 Other abnormal and inconclusive findings on diagnostic imaging of breast: Secondary | ICD-10-CM

## 2021-12-31 ENCOUNTER — Other Ambulatory Visit: Payer: Self-pay | Admitting: Family Medicine

## 2021-12-31 DIAGNOSIS — E041 Nontoxic single thyroid nodule: Secondary | ICD-10-CM

## 2022-01-04 ENCOUNTER — Ambulatory Visit
Admission: RE | Admit: 2022-01-04 | Discharge: 2022-01-04 | Disposition: A | Discharge status: Home / Self Care | Payer: Commercial Managed Care - PPO | Source: Ambulatory Visit | Attending: Family Medicine | Admitting: Family Medicine

## 2022-01-04 DIAGNOSIS — E041 Nontoxic single thyroid nodule: Secondary | ICD-10-CM

## 2022-01-07 ENCOUNTER — Emergency Department (HOSPITAL_COMMUNITY): Payer: Commercial Managed Care - PPO

## 2022-01-07 ENCOUNTER — Encounter (HOSPITAL_COMMUNITY): Payer: Self-pay

## 2022-01-07 ENCOUNTER — Emergency Department (HOSPITAL_COMMUNITY)
Admission: EM | Admit: 2022-01-07 | Discharge: 2022-01-07 | Disposition: A | Discharge status: Home / Self Care | Payer: Commercial Managed Care - PPO | Attending: Emergency Medicine | Admitting: Emergency Medicine

## 2022-01-07 DIAGNOSIS — S90911A Unspecified superficial injury of right ankle, initial encounter: Secondary | ICD-10-CM | POA: Diagnosis present

## 2022-01-07 DIAGNOSIS — N189 Chronic kidney disease, unspecified: Secondary | ICD-10-CM | POA: Insufficient documentation

## 2022-01-07 DIAGNOSIS — W010XXA Fall on same level from slipping, tripping and stumbling without subsequent striking against object, initial encounter: Secondary | ICD-10-CM | POA: Insufficient documentation

## 2022-01-07 DIAGNOSIS — S9304XA Dislocation of right ankle joint, initial encounter: Secondary | ICD-10-CM

## 2022-01-07 DIAGNOSIS — Z79899 Other long term (current) drug therapy: Secondary | ICD-10-CM | POA: Diagnosis not present

## 2022-01-07 LAB — CBC WITH DIFFERENTIAL/PLATELET
Abs Immature Granulocytes: 0.04 10*3/uL (ref 0.00–0.07)
Basophils Absolute: 0 10*3/uL (ref 0.0–0.1)
Basophils Relative: 0 %
Eosinophils Absolute: 0.1 10*3/uL (ref 0.0–0.5)
Eosinophils Relative: 1 %
HCT: 35.8 % — ABNORMAL LOW (ref 36.0–46.0)
Hemoglobin: 11.9 g/dL — ABNORMAL LOW (ref 12.0–15.0)
Immature Granulocytes: 0 %
Lymphocytes Relative: 9 %
Lymphs Abs: 1.1 10*3/uL (ref 0.7–4.0)
MCH: 33.5 pg (ref 26.0–34.0)
MCHC: 33.2 g/dL (ref 30.0–36.0)
MCV: 100.8 fL — ABNORMAL HIGH (ref 80.0–100.0)
Monocytes Absolute: 0.6 10*3/uL (ref 0.1–1.0)
Monocytes Relative: 5 %
Neutro Abs: 9.7 10*3/uL — ABNORMAL HIGH (ref 1.7–7.7)
Neutrophils Relative %: 85 %
Platelets: 171 10*3/uL (ref 150–400)
RBC: 3.55 MIL/uL — ABNORMAL LOW (ref 3.87–5.11)
RDW: 12.8 % (ref 11.5–15.5)
WBC: 11.5 10*3/uL — ABNORMAL HIGH (ref 4.0–10.5)
nRBC: 0 % (ref 0.0–0.2)

## 2022-01-07 LAB — BASIC METABOLIC PANEL
Anion gap: 8 (ref 5–15)
BUN: 56 mg/dL — ABNORMAL HIGH (ref 6–20)
CO2: 20 mmol/L — ABNORMAL LOW (ref 22–32)
Calcium: 9.6 mg/dL (ref 8.9–10.3)
Chloride: 113 mmol/L — ABNORMAL HIGH (ref 98–111)
Creatinine, Ser: 2.66 mg/dL — ABNORMAL HIGH (ref 0.44–1.00)
GFR, Estimated: 20 mL/min — ABNORMAL LOW (ref >60)
Glucose, Bld: 113 mg/dL — ABNORMAL HIGH (ref 70–99)
Potassium: 4.5 mmol/L (ref 3.5–5.1)
Sodium: 141 mmol/L (ref 135–145)

## 2022-01-07 MED ORDER — PROPOFOL 10 MG/ML IV BOLUS
2.0000 mg/kg | Freq: Once | INTRAVENOUS | Status: DC
  Filled 2022-01-07: qty 20

## 2022-01-07 MED ORDER — SODIUM CHLORIDE 0.9 % IV BOLUS: 1000.0000 mL | Freq: Once | INTRAVENOUS | Status: DC

## 2022-01-07 MED ORDER — SODIUM CHLORIDE 0.9 % IV BOLUS
500.0000 mL | Freq: Once | INTRAVENOUS | Status: AC
  Administered 2022-01-07: 500 mL via INTRAVENOUS

## 2022-01-07 MED ORDER — PROPOFOL 10 MG/ML IV BOLUS
INTRAVENOUS | Status: DC | PRN
  Administered 2022-01-07: 30 mg via INTRAVENOUS

## 2022-01-07 MED ORDER — PROPOFOL 10 MG/ML IV BOLUS
INTRAVENOUS | Status: DC | PRN
  Administered 2022-01-07: 20 mg via INTRAVENOUS

## 2022-01-07 MED ORDER — HYDROCODONE-ACETAMINOPHEN 5-325 MG PO TABS: 1.0000 | ORAL_TABLET | Freq: Four times a day (QID) | ORAL | 0 refills | Status: AC | PRN

## 2022-01-07 MED ORDER — HYDROMORPHONE HCL 1 MG/ML IJ SOLN
1.0000 mg | Freq: Once | INTRAMUSCULAR | Status: AC
  Administered 2022-01-07: 1 mg via INTRAVENOUS
  Filled 2022-01-07: qty 1

## 2022-01-07 MED ORDER — ONDANSETRON HCL 4 MG/2ML IJ SOLN
4.0000 mg | Freq: Once | INTRAMUSCULAR | Status: AC
  Administered 2022-01-07: 4 mg via INTRAVENOUS
  Filled 2022-01-07: qty 2

## 2022-01-07 NOTE — Progress Notes (Signed)
Orthopedic Tech Progress Note ?Patient Details:  ?Stacey Mullins ?[birth year] ?[id number] ? ?Ortho Devices ?Type of Ortho Device: Crutches ?Ortho Device/Splint Location: RLE ?Ortho Device/Splint Interventions: Ordered, Application, Adjustment ?  ?Post Interventions ?Patient Tolerated: Ambulated well, Well ?Instructions Provided: Poper ambulation with device, Care of device ? ?Donald Pore ?01/07/2022, 2:07 PM ? ?

## 2022-01-07 NOTE — Sedation Documentation (Signed)
Successful reduction by PA Emmit Alexanders and MD Horton. Splint applied by ortho tech ?

## 2022-01-07 NOTE — ED Triage Notes (Signed)
Pt BIB [facility] for eval of R ankle deformity. Pt reports her cow escaped, she was chasing her cow and she caught her foot in the mud, slipped and fell. Obvious deformity to R ankle, splint applied. +CSM to R foot. ?

## 2022-01-07 NOTE — ED Provider Notes (Signed)
Reduction of fracture ? ?Date/Time: 01/07/2022 12:45 PM ?Performed by: Renne Crigler, PA-C ?Authorized by: Renne Crigler, PA-C  ?Consent given by: patient ?Imaging studies: imaging studies available ?Patient identity confirmed: verbally with patient, provided demographic data and arm band ?Time out: Immediately prior to procedure a "time out" was called to verify the correct patient, procedure, equipment, support staff and site/side marked as required. ?Local anesthesia used: no ? ?Anesthesia: ?Local anesthesia used: no ? ?Sedation: ?Patient sedated: yes (see Dr. Winnifred Friar sedation note for details) ? ?Patient tolerance: patient tolerated the procedure well with no immediate complications ?Comments: Ankle fracture/dislocation was reduced with longitudinal traction of the foot after dorsiflexion. Splinted at bedside. 2+ DP pulse maintained prior to and after procedure.  ? ? ? ?  ?Renne Crigler, PA-C ?01/07/22 1312 ? ?  ?Rozelle Logan, DO ?01/07/22 1459 ? ?

## 2022-01-07 NOTE — Discharge Instructions (Addendum)
You have been seen and discharged from the emergency department.  You sustained a fracture/dislocation of the right ankle.  This was reduced in the emergency department with sedation.  Spoke with Dr. Eulah Pont, orthopedic surgeon.  He recommends nonweightbearing, crutches and to follow-up in the office next week for reevaluation/surgical planning.  Take pain medicine as needed.  Do not mix this medication with alcohol or other sedating medications. Do not drive or do heavy physical activity until you know how this medication affects you.  It may cause drowsiness.  Follow-up with your primary provider for further evaluation and further care. Take home medications as prescribed. If you have any worsening symptoms or further concerns for your health please return to an emergency department for further evaluation. ?

## 2022-01-07 NOTE — ED Provider Notes (Signed)
?[facility] ?Provider Note ? ? ?CSN: [id number] ?Arrival date & time: 01/07/22  1051 ? ?  ? ?History ? ?Chief Complaint  ?Patient presents with  ?? Ankle Injury  ? ? ?Stacey Mullins is a 61 y.o. female. ? ?HPI ? ?61 year old female with past medical history of CKD presents emergency department with right ankle injury.  Patient states that her cat escaped and while she was chasing her down her foot got stuck in the mud and flipped underneath her.  No head injury, no loss of consciousness.  Arrives with obvious right ankle deformity and prearrival splint.  No bleeding or wound.  She is complaining of right ankle pain but denies any other traumatic injury. ? ?Home Medications ?Prior to Admission medications   ?Medication Sig Start Date End Date Taking? Authorizing Provider  ?allopurinol (ZYLOPRIM) 300 MG tablet Take 300 mg by mouth daily.    [provider]  ?amLODipine (NORVASC) 5 MG tablet Take 5 mg by mouth daily.    [provider]  ?Cholecalciferol (VITAMIN D3) 2000 UNITS capsule Take 4,000 Units by mouth daily.     [provider]  ?levothyroxine (SYNTHROID, LEVOTHROID) 150 MCG tablet Take 150 mcg by mouth daily before breakfast.    [provider]  ?losartan (COZAAR) 100 MG tablet Take 100 mg by mouth daily.    [provider]  ?metoprolol tartrate (LOPRESSOR) 25 MG tablet Take 25 mg by mouth daily.    [provider]  ?Multiple Vitamin (MULTIVITAMIN) capsule Take 1 capsule by mouth daily.    [provider]  ?spironolactone (ALDACTONE) 25 MG tablet  03/26/18   [provider]  ?   ? ?Allergies    ?Benicar [olmesartan] and Erythromycin   ? ?Review of Systems   ?Review of Systems  ?Respiratory:  Negative for shortness of breath.   ?Cardiovascular:  Negative for chest pain.  ?Gastrointestinal:  Negative for abdominal pain.  ?Musculoskeletal:  Negative for back pain and neck pain.  ?     + right ankle  deformity and injury  ?Neurological:  Negative for syncope.  ? ?Physical Exam ?Updated Vital Signs ?BP (!) 155/85 (BP Location: Right Arm)   Pulse 70   Temp (!) 97.4 ?F (36.3 ?C) (Temporal)   Resp 20   Ht 5\' 11"  (1.803 m)   Wt 92 kg   SpO2 98%   BMI 28.29 kg/m?  ?Physical Exam ?Vitals and nursing note reviewed.  ?Constitutional:   ?   General: She is not in acute distress. ?   Appearance: Normal appearance.  ?HENT:  ?   Head: Normocephalic.  ?   Mouth/Throat:  ?   Mouth: Mucous membranes are moist.  ?Cardiovascular:  ?   Rate and Rhythm: Normal rate.  ?Pulmonary:  ?   Effort: Pulmonary effort is normal. No respiratory distress.  ?Abdominal:  ?   Palpations: Abdomen is soft.  ?   Tenderness: There is no abdominal tenderness.  ?Musculoskeletal:  ?   Comments: Obvious right ankle deformity, palpable DP pulses, neuro intact with good capillary refill, prearrival splint in place, no bleeding/dried blood  ?Skin: ?   General: Skin is warm.  ?Neurological:  ?   Mental Status: She is alert and oriented to person, place, and time. Mental status is at baseline.  ?Psychiatric:     ?   Mood and Affect: Mood normal.  ? ? ?ED Results / Procedures / Treatments   ?Labs ?(all labs ordered are  listed, but only abnormal results are displayed) ?Labs Reviewed  ?CBC WITH DIFFERENTIAL/PLATELET  ?BASIC METABOLIC PANEL  ? ? ?EKG ?None ? ?Radiology ?No results found. ? ?Procedures ?.Sedation ? ?Date/Time: 01/07/2022 1:15 PM ?Performed by: Lorelle Gibbs, DO ?Authorized by: Lorelle Gibbs, DO  ? ?Consent:  ?  Consent obtained:  Written ?  Consent given by:  Patient ?  Risks discussed:  Allergic reaction, nausea, prolonged hypoxia resulting in organ damage, prolonged sedation necessitating reversal, respiratory compromise necessitating ventilatory assistance and intubation and vomiting ?  Alternatives discussed:  Analgesia without sedation ?Universal protocol:  ?  Immediately prior to procedure, a time out was called: yes    ?Pre-sedation assessment:  ?  Time since last food or drink:  6 ?  ASA classification: class 2 - patient with mild systemic disease   ?  Mallampati score:  II - soft palate, uvula, fauces visible ?  Neck mobility: normal   ?  Pre-sedation assessments completed and reviewed: airway patency, cardiovascular function, mental status, nausea/vomiting, pain level and respiratory function   ?Immediate pre-procedure details:  ?  Reviewed: vital signs and NPO status   ?  Verified: bag valve mask available, emergency equipment available, intubation equipment available, IV patency confirmed, oxygen available and suction available   ?Procedure details (see MAR for exact dosages):  ?  Preoxygenation:  Nasal cannula ?  Sedation:  Propofol ?  Intended level of sedation: deep and moderate (conscious sedation) ?  Analgesia:  None ?  Intra-procedure monitoring:  Blood pressure monitoring, cardiac monitor, continuous capnometry, continuous pulse oximetry, frequent LOC assessments and frequent vital sign checks ?  Intra-procedure events: none   ?  Total Provider sedation time (minutes):  25 ?Post-procedure details:  ?  Post-sedation assessments completed and reviewed: airway patency, cardiovascular function, mental status, nausea/vomiting, pain level and respiratory function   ?  Patient is stable for discharge or admission: yes   ?  Procedure completion:  Tolerated well, no immediate complications  ? ? ?Medications Ordered in ED ?Medications  ?sodium chloride 0.9 % bolus 500 mL (has no administration in time range)  ?ondansetron (ZOFRAN) injection 4 mg (has no administration in time range)  ?HYDROmorphone (DILAUDID) injection 1 mg (has no administration in time range)  ? ? ?ED Course/ Medical Decision Making/ A&P ?  ?                        ?Medical Decision Making ?Amount and/or Complexity of Data Reviewed ?Labs: ordered. ?Radiology: ordered. ? ?Risk ?Prescription drug management. ? ? ?60 female presents emergency  department with traumatic right ankle deformity.  Splint placed prior to arrival.  Vitals are stable on arrival.  Foot is neurovascularly intact. ? ?X-rays show comminuted fracture/dislocation of the right ankle.  Procedural sedation performed with reduction of ankle.  Postreduction films show appropriate alignment of the ankle.  Foot remains neurovascularly intact.  Orthotec placed short leg stirrup.  Spoke with Dr. Percell Miller, orthopedic surgeon.  He recommends nonweightbearing and outpatient office follow-up for reevaluation/surgical planning. ? ?Blood work shows baseline CKD when compared to lab results from care everywhere.  Patient states this is followed by her outpatient doctor.  Patient tolerated sedation and reduction well.  She is ambulating with crutches without difficulty.  Plan for outpatient pain control.  Foot remains neurovascularly intact. ? ?Patient at this time appears safe and stable for discharge and close outpatient follow up. Discharge plan and strict return to ED precautions discussed,  patient verbalizes understanding and agreement. ? ? ?  ? ? ? ? ?Final Clinical Impression(s) / ED Diagnoses ?Final diagnoses:  ?None  ? ? ?Rx / DC Orders ?ED Discharge Orders   ? ? None  ? ?  ? ? ?  ?Lorelle Gibbs, DO ?01/07/22 1425 ? ?

## 2022-01-07 NOTE — Progress Notes (Signed)
Orthopedic Tech Progress Note ?Patient Details:  ?Stacey Mullins ?Oct 12, 1960 ?[id number] ? ?After PA/MD did a reduction of ankle I applied splint. PA did the held ankle  ? ?Ortho Devices ?Type of Ortho Device: Stirrup splint, Short leg splint ?Ortho Device/Splint Location: RLE ?Ortho Device/Splint Interventions: Ordered, Application, Adjustment ?  ?Post Interventions ?Patient Tolerated: Well ?Instructions Provided: Care of device ? ?Donald Pore ?01/07/2022, 12:33 PM ? ?

## 2022-01-13 ENCOUNTER — Encounter (HOSPITAL_COMMUNITY): Payer: Self-pay | Admitting: Orthopedic Surgery

## 2022-01-13 ENCOUNTER — Encounter (HOSPITAL_COMMUNITY): Payer: Self-pay | Admitting: Anesthesiology

## 2022-01-13 ENCOUNTER — Other Ambulatory Visit: Payer: Self-pay

## 2022-01-13 ENCOUNTER — Other Ambulatory Visit: Payer: Self-pay | Admitting: Orthopedic Surgery

## 2022-01-13 NOTE — Progress Notes (Signed)
PCP - Dr. Cliffton Asters  ?Cardiologist - denies ?EKG - DOS ?Chest x-ray -  ?ECHO -  ?Cardiac Cath -  ?CPAP -  ? ?Blood Thinner Instructions:  ?Aspirin Instructions: pt has not taken ASA in 2 days, will not take DOS ?ERAS Protcol - n/a ?COVID TEST- n/a ? ?Anesthesia review: n/a ? ? ? ?------------- ? ?SDW INSTRUCTIONS: ? ?Your procedure is scheduled on Friday 4/14. Please report to [facility] Main Entrance "A" at 0530 A.M., and check in at the Admitting office. Call this number if you have problems the morning of surgery: [phone] ? ? ?Remember: Do not eat or drink after midnight the night before your surgery ?  ?Medications to take morning of surgery with a sip of water include: ?allopurinol (ZYLOPRIM) ?amLODipine (NORVASC)  ?HYDROcodone-acetaminophen (NORCO) if needed ?levothyroxine (SYNTHROID)  ?loratadine (CLARITIN)  ? ?As of today, STOP taking any Aspirin (unless otherwise instructed by your surgeon), Aleve, Naproxen, Ibuprofen, Motrin, Advil, Goody's, BC's, all herbal medications, fish oil, and all vitamins. ? ?  ?The Morning of Surgery ?Do not wear jewelry, make-up or nail polish. ?Do not wear lotions, powders, or perfumes, or deodorant ?Do not bring valuables to the hospital. ?[facility] is not responsible for any belongings or valuables. ? ?If you are a smoker, DO NOT Smoke 24 hours prior to surgery ? ?If you wear a CPAP at night please bring your mask the morning of surgery  ? ?Remember that you must have someone to transport you home after your surgery, and remain with you for 24 hours if you are discharged the same day. ? ?Please bring cases for contacts, glasses, hearing aids, dentures or bridgework because it cannot be worn into surgery.  ? ?Patients discharged the day of surgery will not be allowed to drive home.  ? ?Please shower the NIGHT BEFORE/MORNING OF SURGERY (use antibacterial soap like DIAL soap if possible). Wear comfortable clothes the morning of surgery. Oral Hygiene is also important to  reduce your risk of infection.  Remember - BRUSH YOUR TEETH THE MORNING OF SURGERY WITH YOUR REGULAR TOOTHPASTE ? ?Patient denies shortness of breath, fever, cough and chest pain.  ? ? ?   ? ?

## 2022-01-13 NOTE — Progress Notes (Signed)
While on the phone with pt for pre-op instructions, pt mentions that Dr. Kyra Leyland office called and cancelled surgery for 4/14 but gave pt the option that Dr. Sherilyn Dacosta could operate on 4/14, or if pt wanted to wait until 4/15 for surgery at 0730. So pt instructed to arrive at admitting office at 0530 if surgery was decided to be 4/14 and to check in at the ED at 0630 if surgery to be 4/15.  ?

## 2022-01-14 ENCOUNTER — Ambulatory Visit (HOSPITAL_BASED_OUTPATIENT_CLINIC_OR_DEPARTMENT_OTHER)
Admission: RE | Admit: 2022-01-14 | Payer: Commercial Managed Care - PPO | Source: Home / Self Care | Admitting: Orthopedic Surgery

## 2022-01-14 ENCOUNTER — Encounter (HOSPITAL_COMMUNITY): Payer: Self-pay | Admitting: Orthopedic Surgery

## 2022-01-14 ENCOUNTER — Other Ambulatory Visit: Payer: Self-pay

## 2022-01-14 HISTORY — DX: Sleep apnea, unspecified: G47.30

## 2022-01-14 SURGERY — OPEN REDUCTION INTERNAL FIXATION (ORIF) ANKLE FRACTURE
Anesthesia: Choice | Site: Ankle | Laterality: Right

## 2022-01-14 NOTE — Progress Notes (Signed)
TWO VISITORS ARE ALLOWED TO COME WITH YOU AND STAY IN THE SURGICAL WAITING ROOM ONLY DURING PRE OP AND PROCEDURE DAY OF SURGERY.  ? ?PCP - Dr Laurann Montana ?Cardiologist - n/a ? ?Chest x-ray - n/a ?EKG - DOS ?Stress Test - 08/09/19 CE  ?ECHO - 08/30/13 ?Cardiac Cath - n/a ? ?ICD Pacemaker/Loop - n/a ? ?Sleep Study -  Yes ?CPAP - uses CPAP nightly ? ?Aspirin Instructions: Last dose on 01/10/22. ? ?Do not take Jardiance today (01/14/22), and tomorrow on Saturday (DOS). ? ?Anesthesia review: Yes ? ?STOP now taking any Aspirin (unless otherwise instructed by your surgeon), Aleve, Naproxen, Ibuprofen, Motrin, Advil, Goody's, BC's, all herbal medications, fish oil, and all vitamins.  ? ?Coronavirus Screening ?Do you have any of the following symptoms:  ?Cough yes/no: No ?Fever (>100.26F)  yes/no: No ?Runny nose yes/no: No ?Sore throat yes/no: No ?Difficulty breathing/shortness of breath  yes/no: No ? ?Have you traveled in the last 14 days and where? yes/no: No ? ?Patient verbalized understanding of instructions that were given via phone. ?

## 2022-01-14 NOTE — Anesthesia Preprocedure Evaluation (Addendum)
Anesthesia Evaluation  ?Patient identified by MRN, date of birth, ID band ?Patient awake ? ? ? ?Reviewed: ?Allergy & Precautions, NPO status , Patient's Chart, lab work & pertinent test results ? ?History of Anesthesia Complications ?Negative for: history of anesthetic complications ? ?Airway ?Mallampati: III ? ? ?Neck ROM: Full ? ? ? Dental ? ?(+) Dental Advisory Given, Teeth Intact ?  ?Pulmonary ?sleep apnea and Continuous Positive Airway Pressure Ventilation ,  ?  ?Pulmonary exam normal ? ? ? ? ? ? ? Cardiovascular ?hypertension, Pt. on medications ?Normal cardiovascular exam ? ? ?  ?Neuro/Psych ? ?hx transient global amnesia ? ?negative psych ROS  ? GI/Hepatic ?negative GI ROS, Neg liver ROS,   ?Endo/Other  ?Hypothyroidism  ? Renal/GU ?CRFRenal disease  ? ?  ?Musculoskeletal ? ?(+) Arthritis ,  ?Gout ?  ? Abdominal ?  ?Peds ? Hematology ? ?(+) Blood dyscrasia, anemia ,   ?Anesthesia Other Findings ? ? Reproductive/Obstetrics ? ?  ? ? ? ? ? ? ? ? ? ? ? ? ? ?  ?  ? ? ? ? ? ? ? ?Anesthesia Physical ?Anesthesia Plan ? ?ASA: 2 ? ?Anesthesia Plan: General  ? ?Post-op Pain Management: Regional block* and Tylenol PO (pre-op)*  ? ?Induction: Intravenous ? ?PONV Risk Score and Plan: 3 and Treatment may vary due to age or medical condition, Ondansetron, Dexamethasone and Midazolam ? ?Airway Management Planned: LMA ? ?Additional Equipment: None ? ?Intra-op Plan:  ? ?Post-operative Plan: Extubation in OR ? ?Informed Consent: I have reviewed the patients History and Physical, chart, labs and discussed the procedure including the risks, benefits and alternatives for the proposed anesthesia with the patient or authorized representative who has indicated his/her understanding and acceptance.  ? ? ? ?Dental advisory given ? ?Plan Discussed with: CRNA and Anesthesiologist ? ?Anesthesia Plan Comments:   ? ? ? ? ? ?Anesthesia Quick Evaluation ? ?

## 2022-01-15 ENCOUNTER — Other Ambulatory Visit: Payer: Self-pay

## 2022-01-15 ENCOUNTER — Ambulatory Visit (HOSPITAL_COMMUNITY): Payer: Commercial Managed Care - PPO

## 2022-01-15 ENCOUNTER — Ambulatory Visit (HOSPITAL_COMMUNITY): Payer: Commercial Managed Care - PPO | Admitting: Anesthesiology

## 2022-01-15 ENCOUNTER — Encounter (HOSPITAL_COMMUNITY): Payer: Self-pay | Admitting: Orthopedic Surgery

## 2022-01-15 ENCOUNTER — Encounter (HOSPITAL_COMMUNITY)
Admission: RE | Disposition: A | Discharge status: Home / Self Care | Payer: Self-pay | Source: Home / Self Care | Attending: Orthopedic Surgery

## 2022-01-15 ENCOUNTER — Ambulatory Visit (HOSPITAL_COMMUNITY)
Admission: RE | Admit: 2022-01-15 | Discharge: 2022-01-15 | Disposition: A | Discharge status: Home / Self Care | Payer: Commercial Managed Care - PPO | Attending: Orthopedic Surgery | Admitting: Orthopedic Surgery

## 2022-01-15 ENCOUNTER — Ambulatory Visit (HOSPITAL_BASED_OUTPATIENT_CLINIC_OR_DEPARTMENT_OTHER): Payer: Commercial Managed Care - PPO | Admitting: Anesthesiology

## 2022-01-15 DIAGNOSIS — G4730 Sleep apnea, unspecified: Secondary | ICD-10-CM | POA: Diagnosis not present

## 2022-01-15 DIAGNOSIS — S82891A Other fracture of right lower leg, initial encounter for closed fracture: Secondary | ICD-10-CM | POA: Insufficient documentation

## 2022-01-15 DIAGNOSIS — W010XXA Fall on same level from slipping, tripping and stumbling without subsequent striking against object, initial encounter: Secondary | ICD-10-CM | POA: Insufficient documentation

## 2022-01-15 DIAGNOSIS — I129 Hypertensive chronic kidney disease with stage 1 through stage 4 chronic kidney disease, or unspecified chronic kidney disease: Secondary | ICD-10-CM | POA: Diagnosis not present

## 2022-01-15 DIAGNOSIS — N189 Chronic kidney disease, unspecified: Secondary | ICD-10-CM | POA: Diagnosis not present

## 2022-01-15 HISTORY — DX: Type 2 diabetes mellitus without complications: E11.9

## 2022-01-15 HISTORY — DX: Anemia, unspecified: D64.9

## 2022-01-15 HISTORY — DX: Unspecified osteoarthritis, unspecified site: M19.90

## 2022-01-15 HISTORY — PX: ORIF ANKLE FRACTURE: SHX5408

## 2022-01-15 LAB — SURGICAL PCR SCREEN
MRSA, PCR: NEGATIVE
Staphylococcus aureus: POSITIVE — AB

## 2022-01-15 SURGERY — OPEN REDUCTION INTERNAL FIXATION (ORIF) ANKLE FRACTURE
Anesthesia: General | Site: Ankle | Laterality: Right

## 2022-01-15 MED ORDER — VANCOMYCIN HCL 500 MG IV SOLR
INTRAVENOUS | Status: AC
  Filled 2022-01-15: qty 10

## 2022-01-15 MED ORDER — OXYCODONE HCL 5 MG PO TABS: 5.0000 mg | ORAL_TABLET | Freq: Once | ORAL | Status: DC | PRN

## 2022-01-15 MED ORDER — FENTANYL CITRATE (PF) 100 MCG/2ML IJ SOLN: 50.0000 ug | Freq: Once | INTRAMUSCULAR | Status: AC

## 2022-01-15 MED ORDER — LIDOCAINE 2% (20 MG/ML) 5 ML SYRINGE
INTRAMUSCULAR | Status: AC
  Filled 2022-01-15: qty 5

## 2022-01-15 MED ORDER — PROPOFOL 10 MG/ML IV BOLUS
INTRAVENOUS | Status: DC | PRN
  Administered 2022-01-15: 160 mg via INTRAVENOUS
  Administered 2022-01-15: 40 mg via INTRAVENOUS

## 2022-01-15 MED ORDER — ORAL CARE MOUTH RINSE: 15.0000 mL | Freq: Once | OROMUCOSAL | Status: AC

## 2022-01-15 MED ORDER — CEFAZOLIN SODIUM-DEXTROSE 2-3 GM-%(50ML) IV SOLR
INTRAVENOUS | Status: DC | PRN
  Administered 2022-01-15: 2 g via INTRAVENOUS

## 2022-01-15 MED ORDER — BUPIVACAINE-EPINEPHRINE (PF) 0.5% -1:200000 IJ SOLN
INTRAMUSCULAR | Status: DC | PRN
  Administered 2022-01-15: 25 mL via PERINEURAL
  Administered 2022-01-15: 15 mL via PERINEURAL

## 2022-01-15 MED ORDER — DEXAMETHASONE SODIUM PHOSPHATE 10 MG/ML IJ SOLN
INTRAMUSCULAR | Status: AC
  Filled 2022-01-15: qty 1

## 2022-01-15 MED ORDER — ONDANSETRON HCL 4 MG/2ML IJ SOLN
INTRAMUSCULAR | Status: DC | PRN
  Administered 2022-01-15: 4 mg via INTRAVENOUS

## 2022-01-15 MED ORDER — CEFAZOLIN SODIUM-DEXTROSE 2-4 GM/100ML-% IV SOLN
INTRAVENOUS | Status: AC
  Filled 2022-01-15: qty 100

## 2022-01-15 MED ORDER — SODIUM CHLORIDE 0.9 % IV SOLN: INTRAVENOUS | Status: DC

## 2022-01-15 MED ORDER — ONDANSETRON HCL 4 MG/2ML IJ SOLN: 4.0000 mg | Freq: Once | INTRAMUSCULAR | Status: DC | PRN

## 2022-01-15 MED ORDER — PROPOFOL 10 MG/ML IV BOLUS
INTRAVENOUS | Status: AC
  Filled 2022-01-15: qty 20

## 2022-01-15 MED ORDER — ONDANSETRON HCL 4 MG/2ML IJ SOLN
INTRAMUSCULAR | Status: AC
  Filled 2022-01-15: qty 2

## 2022-01-15 MED ORDER — MIDAZOLAM HCL 2 MG/2ML IJ SOLN: 2.0000 mg | Freq: Once | INTRAMUSCULAR | Status: AC

## 2022-01-15 MED ORDER — DEXAMETHASONE SODIUM PHOSPHATE 10 MG/ML IJ SOLN
INTRAMUSCULAR | Status: DC | PRN
  Administered 2022-01-15: 5 mg via INTRAVENOUS

## 2022-01-15 MED ORDER — FENTANYL CITRATE (PF) 250 MCG/5ML IJ SOLN
INTRAMUSCULAR | Status: AC
  Filled 2022-01-15: qty 5

## 2022-01-15 MED ORDER — MIDAZOLAM HCL 2 MG/2ML IJ SOLN
INTRAMUSCULAR | Status: AC
  Filled 2022-01-15: qty 2

## 2022-01-15 MED ORDER — CHLORHEXIDINE GLUCONATE 0.12 % MT SOLN
15.0000 mL | Freq: Once | OROMUCOSAL | Status: AC
  Administered 2022-01-15: 15 mL via OROMUCOSAL
  Filled 2022-01-15: qty 15

## 2022-01-15 MED ORDER — FENTANYL CITRATE (PF) 100 MCG/2ML IJ SOLN: 25.0000 ug | INTRAMUSCULAR | Status: DC | PRN

## 2022-01-15 MED ORDER — MIDAZOLAM HCL 2 MG/2ML IJ SOLN
INTRAMUSCULAR | Status: AC
  Administered 2022-01-15: 2 mg via INTRAVENOUS
  Filled 2022-01-15: qty 2

## 2022-01-15 MED ORDER — EPHEDRINE SULFATE-NACL 50-0.9 MG/10ML-% IV SOSY
PREFILLED_SYRINGE | INTRAVENOUS | Status: DC | PRN
  Administered 2022-01-15: 5 mg via INTRAVENOUS
  Administered 2022-01-15: 10 mg via INTRAVENOUS

## 2022-01-15 MED ORDER — ACETAMINOPHEN 500 MG PO TABS
1000.0000 mg | ORAL_TABLET | Freq: Once | ORAL | Status: AC
  Administered 2022-01-15: 1000 mg via ORAL
  Filled 2022-01-15: qty 2

## 2022-01-15 MED ORDER — FENTANYL CITRATE (PF) 100 MCG/2ML IJ SOLN
INTRAMUSCULAR | Status: AC
  Administered 2022-01-15: 50 ug via INTRAVENOUS
  Filled 2022-01-15: qty 2

## 2022-01-15 MED ORDER — OXYCODONE HCL 5 MG/5ML PO SOLN: 5.0000 mg | Freq: Once | ORAL | Status: DC | PRN

## 2022-01-15 MED ORDER — LIDOCAINE 2% (20 MG/ML) 5 ML SYRINGE
INTRAMUSCULAR | Status: DC | PRN
  Administered 2022-01-15: 40 mg via INTRAVENOUS

## 2022-01-15 MED ORDER — SODIUM CHLORIDE 0.9 % IR SOLN
Status: DC | PRN
  Administered 2022-01-15: 1000 mL

## 2022-01-15 SURGICAL SUPPLY — 97 items
ADH SKN CLS APL DERMABOND .7 (GAUZE/BANDAGES/DRESSINGS) ×1
ALCOHOL 70% 16 OZ (MISCELLANEOUS) ×3 IMPLANT
BAG COUNTER SPONGE SURGICOUNT (BAG) ×3 IMPLANT
BAG SPNG CNTER NS LX DISP (BAG) ×1
BANDAGE ESMARK 6X9 LF (GAUZE/BANDAGES/DRESSINGS) IMPLANT
BIT DRILL 2 CANN GRADUATED (BIT) ×1 IMPLANT
BIT DRILL 2.5 CANN LNG (BIT) ×1 IMPLANT
BIT DRILL 3.5 CANN STRL (BIT) ×1 IMPLANT
BNDG CMPR 9X6 STRL LF SNTH (GAUZE/BANDAGES/DRESSINGS)
BNDG CMPR MED 10X6 ELC LF (GAUZE/BANDAGES/DRESSINGS) ×1
BNDG COHESIVE 6X5 TAN NS LF (GAUZE/BANDAGES/DRESSINGS) ×3 IMPLANT
BNDG ELASTIC 4X5.8 VLCR STR LF (GAUZE/BANDAGES/DRESSINGS) ×1 IMPLANT
BNDG ELASTIC 6X10 VLCR STRL LF (GAUZE/BANDAGES/DRESSINGS) ×1 IMPLANT
BNDG ESMARK 6X9 LF (GAUZE/BANDAGES/DRESSINGS)
CANISTER SUCT 3000ML PPV (MISCELLANEOUS) ×3 IMPLANT
COVER SURGICAL LIGHT HANDLE (MISCELLANEOUS) ×3 IMPLANT
CUFF TOURN SGL QUICK 34 (TOURNIQUET CUFF)
CUFF TRNQT CYL 34X4.125X (TOURNIQUET CUFF) IMPLANT
DERMABOND ADVANCED (GAUZE/BANDAGES/DRESSINGS) ×1
DERMABOND ADVANCED .7 DNX12 (GAUZE/BANDAGES/DRESSINGS) ×2 IMPLANT
DRAPE C-ARM 42X72 X-RAY (DRAPES) ×3 IMPLANT
DRAPE C-ARMOR (DRAPES) ×3 IMPLANT
DRAPE HIP W/POCKET STRL (MISCELLANEOUS) ×3 IMPLANT
DRAPE IMP U-DRAPE 54X76 (DRAPES) ×6 IMPLANT
DRAPE INCISE 23X17 IOBAN STRL (DRAPES) ×1
DRAPE INCISE 23X17 STRL (DRAPES) ×2 IMPLANT
DRAPE INCISE IOBAN 23X17 STRL (DRAPES) ×1 IMPLANT
DRAPE INCISE IOBAN 66X45 STRL (DRAPES) ×3 IMPLANT
DRAPE U-SHAPE 47X51 STRL (DRAPES) ×3 IMPLANT
DRSG ADAPTIC 3X8 NADH LF (GAUZE/BANDAGES/DRESSINGS) ×1 IMPLANT
DRSG AQUACEL AG ADV 3.5X 6 (GAUZE/BANDAGES/DRESSINGS) ×3 IMPLANT
DRSG PAD ABDOMINAL 8X10 ST (GAUZE/BANDAGES/DRESSINGS) ×1 IMPLANT
ELECT REM PT RETURN 9FT ADLT (ELECTROSURGICAL) ×2
ELECTRODE REM PT RTRN 9FT ADLT (ELECTROSURGICAL) ×2 IMPLANT
GAUZE SPONGE 4X4 12PLY STRL (GAUZE/BANDAGES/DRESSINGS) ×1 IMPLANT
GLOVE BIO SURGEON STRL SZ7.5 (GLOVE) ×6 IMPLANT
GLOVE BIOGEL PI IND STRL 7.5 (GLOVE) ×2 IMPLANT
GLOVE BIOGEL PI IND STRL 8 (GLOVE) ×2 IMPLANT
GLOVE BIOGEL PI INDICATOR 7.5 (GLOVE) ×1
GLOVE BIOGEL PI INDICATOR 8 (GLOVE) ×1
GLOVE SRG 8 PF TXTR STRL LF DI (GLOVE) ×2 IMPLANT
GLOVE SURG ENC MOIS LTX SZ8 (GLOVE) ×6 IMPLANT
GLOVE SURG UNDER POLY LF SZ8 (GLOVE) ×2
GOWN STRL REUS W/ TWL LRG LVL3 (GOWN DISPOSABLE) ×4 IMPLANT
GOWN STRL REUS W/ TWL XL LVL3 (GOWN DISPOSABLE) ×2 IMPLANT
GOWN STRL REUS W/TWL LRG LVL3 (GOWN DISPOSABLE) ×4
GOWN STRL REUS W/TWL XL LVL3 (GOWN DISPOSABLE) ×2
HOOD PEEL AWAY FLYTE STAYCOOL (MISCELLANEOUS) ×6 IMPLANT
K-WIRE BB-TAK (WIRE) ×4
KIT BASIN OR (CUSTOM PROCEDURE TRAY) ×3 IMPLANT
KIT TURNOVER KIT B (KITS) ×3 IMPLANT
KWIRE BB-TAK (WIRE) IMPLANT
MANIFOLD NEPTUNE II (INSTRUMENTS) IMPLANT
NEEDLE 22X1 1/2 (OR ONLY) (NEEDLE) IMPLANT
NS IRRIG 1000ML POUR BTL (IV SOLUTION) ×3 IMPLANT
PACK ORTHO EXTREMITY (CUSTOM PROCEDURE TRAY) ×3 IMPLANT
PAD ARMBOARD 7.5X6 YLW CONV (MISCELLANEOUS) ×6 IMPLANT
PAD CAST 4YDX4 CTTN HI CHSV (CAST SUPPLIES) IMPLANT
PADDING CAST ABS 4INX4YD NS (CAST SUPPLIES) ×1
PADDING CAST ABS 6INX4YD NS (CAST SUPPLIES) ×1
PADDING CAST ABS COTTON 4X4 ST (CAST SUPPLIES) IMPLANT
PADDING CAST ABS COTTON 6X4 NS (CAST SUPPLIES) IMPLANT
PADDING CAST COTTON 4X4 STRL (CAST SUPPLIES)
PLATE LOCK DIST FIB 5H RT (Plate) ×1 IMPLANT
SCREW LOCKING 2.7X10 ANKLE (Screw) ×1 IMPLANT
SCREW LOCKING 2.7X14MM (Screw) ×1 IMPLANT
SCREW LOCKING 2.7X16MM (Screw) ×3 IMPLANT
SCREW LOW PROFILE 3.5X14 (Screw) ×3 IMPLANT
SEALER BIPOLAR AQUA 2.3 (INSTRUMENTS) ×3 IMPLANT
SET INTERPULSE LAVAGE W/TIP (ORTHOPEDIC DISPOSABLE SUPPLIES) ×3 IMPLANT
SPONGE T-LAP 18X18 ~~LOC~~+RFID (SPONGE) ×3 IMPLANT
STOCKINETTE IMPERVIOUS 9X36 MD (GAUZE/BANDAGES/DRESSINGS) ×3 IMPLANT
STOCKINETTE IMPERVIOUS LG (DRAPES) ×3 IMPLANT
STRIP CLOSURE SKIN 1/2X4 (GAUZE/BANDAGES/DRESSINGS) IMPLANT
SUCTION FRAZIER HANDLE 10FR (MISCELLANEOUS) ×2
SUCTION TUBE FRAZIER 10FR DISP (MISCELLANEOUS) ×2 IMPLANT
SUT ETHIBOND 2 V 37 (SUTURE) ×6 IMPLANT
SUT ETHILON 3 0 FSL (SUTURE) ×3 IMPLANT
SUT MNCRL AB 4-0 PS2 18 (SUTURE) IMPLANT
SUT MON AB 2-0 CT1 27 (SUTURE) IMPLANT
SUT VIC AB 0 CT1 27 (SUTURE) ×2
SUT VIC AB 0 CT1 27XBRD ANBCTR (SUTURE) ×2 IMPLANT
SUT VIC AB 1 CT1 27 (SUTURE) ×2
SUT VIC AB 1 CT1 27XBRD ANTBC (SUTURE) ×2 IMPLANT
SUT VIC AB 2-0 SH 27 (SUTURE) ×2
SUT VIC AB 2-0 SH 27XBRD (SUTURE) ×2 IMPLANT
SUT VLOC 180 0 9IN  GS21 (SUTURE) ×2
SUT VLOC 180 0 9IN GS21 (SUTURE) ×2 IMPLANT
SYR BULB IRRIG 60ML STRL (SYRINGE) ×3 IMPLANT
SYR CONTROL 10ML LL (SYRINGE) IMPLANT
TOWEL GREEN STERILE (TOWEL DISPOSABLE) ×3 IMPLANT
TOWEL GREEN STERILE FF (TOWEL DISPOSABLE) ×3 IMPLANT
TUBE CONNECTING 12X1/4 (SUCTIONS) ×3 IMPLANT
TUBE SUCT ARGYLE STRL (TUBING) ×3 IMPLANT
UNDERPAD 30X36 HEAVY ABSORB (UNDERPADS AND DIAPERS) ×3 IMPLANT
WATER STERILE IRR 1000ML POUR (IV SOLUTION) ×3 IMPLANT
YANKAUER SUCT BULB TIP NO VENT (SUCTIONS) IMPLANT

## 2022-01-15 NOTE — H&P (Signed)
ORTHOPAEDIC H&P ? ?Chief Complaint: R ankle fracture ? ?HPI: ?Stacey Mullins is a 61 y.o. female who had a fall about a week ago on 01/07/2022 when she slipped in the mud while she was at her cat.  She had immediate pain and deformity of her right ankle.  She was brought to the emergency room where she was found to have a right ankle fracture and dislocation.  She was closed reduced and splinted.  He was seen by my partner Dr. Griffin Basil who is scheduled her for surgery at the surgery center earlier this week but due to her kidney disease was unable to perform her surgery there, and was recommended to have her surgery performed at the main hospital.  Dr. Griffin Basil referred the patient to me for further care.  Patient has been doing well at home.  She denies pain in other joints or extremities.  She denies any distal numbness and tingling in the buttock.  She has been elevating the leg at home and been nonweightbearing with a walker. ? ?Past Medical History:  ?Diagnosis Date  ? Anemia   ? Arthritis   ? hands - osetoarthritis  ? Chronic kidney disease   ? proteinuria  ? Gout   ? Hypertension   ? Hypothyroid   ? Sleep apnea   ? Uses CPAP nightly  ? Thyroid disease   ? ?Past Surgical History:  ?Procedure Laterality Date  ? EYE SURGERY Right   ? Laser  ? RENAL BIOPSY    ? NGA nephropathy  ? WISDOM TOOTH EXTRACTION    ? ?Social History  ? ?Socioeconomic History  ? Marital status: Married  ?  Spouse name: Not on file  ? Number of children: Not on file  ? Years of education: Not on file  ? Highest education level: Not on file  ?Occupational History  ?  Employer: [employer]  ?  Comment: insurance company  ?Tobacco Use  ? Smoking status: Never  ? Smokeless tobacco: Never  ?Vaping Use  ? Vaping Use: Never used  ?Substance and Sexual Activity  ? Alcohol use: No  ? Drug use: No  ? Sexual activity: Yes  ?  Birth control/protection: Post-menopausal  ?Other Topics Concern  ? Not on file  ?Social History Narrative  ? Not on file   ? ?Social Determinants of Health  ? ?Financial Resource Strain: Not on file  ?Food Insecurity: Not on file  ?Transportation Needs: Not on file  ?Physical Activity: Not on file  ?Stress: Not on file  ?Social Connections: Not on file  ? ?Family History  ?Problem Relation Age of Onset  ? Arthritis/Rheumatoid Mother   ? Thyroid disease Mother   ? Factor V Leiden deficiency Mother   ? Hypertension Father   ? ?Allergies  ?Allergen Reactions  ? Benicar [Olmesartan]   ?  Coughing ?  ? Erythromycin   ?  Thrush  ? Lisinopril   ?  Other reaction(s): cough  ? Omeprazole   ?  Other reaction(s): incontinence  ? ? ? ?Positive ROS: All other systems have been reviewed and were otherwise negative with the exception of those mentioned in the HPI and as above. ? ?Physical Exam: ?General: Alert, no acute distress ?Cardiovascular: No pedal edema ?Respiratory: No cyanosis, no use of accessory musculature ?Skin: No lesions in the area of chief complaint ?Neurologic: Sensation intact distally ?Psychiatric: Patient is competent for consent with normal mood and affect ? ?RLE splint was windowed but not completely removed,  there is mild swelling of the right ankle. ? Sensation intact to the dorsal and plantar aspects of the foot. ? Intact EHL FHL motor function ? Toes perfused, DP 2+ ? Tender and painless knee range of motion ? ? ?IMAGING: X-rays of the right ankle demonstrate a ankle fracture dislocation with lateral medial malleolus fracture, possible small posterior malleolus fracture, good alignment following closed reduction in the splint ? ?Assessment: ?Active Problems: ?  * No active hospital problems. * ? ? ?Right ankle fracture dislocation ? ?Plan: ?Patient has been n.p.o. discussed plan for surgery with open reduction internal fixation of the lateral medial malleolus fracture, possible syndesmotic fixation if unstable.  Plan for nonweightbearing in the splint post op until follow up in clinic. ?The risks benefits and alternatives  were discussed with the patient including but not limited to the risks of nonoperative treatment, versus surgical intervention including infection, bleeding, nerve injury, malunion, nonunion, the need for revision surgery, hardware prominence, hardware failure, the need for hardware removal, blood clots, posttraumatic arthritis, cardiopulmonary complications, morbidity, mortality, among others, and they were willing to proceed.  Consent was signed by myself and the patient.  Right ankle was marked.  ? ? ? ?Willaim Sheng, MD ? ?Contact information:   ?Weekdays 7am-5pm epic message Dr. Zachery Dakins, or call office for patient follow up: [phone] ?After hours and holidays please check [email or web address] for group call information for [facility]  ?

## 2022-01-15 NOTE — Anesthesia Procedure Notes (Signed)
Anesthesia Regional Block: Adductor canal block  ? ?Pre-Anesthetic Checklist: , timeout performed,  Correct Patient, Correct Site, Correct Laterality,  Correct Procedure, Correct Position, site marked,  Risks and benefits discussed,  Surgical consent,  Pre-op evaluation,  At surgeon's request and post-op pain management ? ?Laterality: Right ? ?Prep: chloraprep     ?  ?Needles:  ?Injection technique: Single-shot ? ?Needle Type: Echogenic Needle   ? ? ?Needle Length: 10cm  ?Needle Gauge: 21  ? ? ? ?Additional Needles: ? ? ?Narrative:  ?Start time: 01/15/2022 7:16 AM ?End time: 01/15/2022 7:19 AM ?Injection made incrementally with aspirations every 5 mL. ? ?Performed by: Personally  ?Anesthesiologist: Beryle Lathe, MD ? ?Additional Notes: ?No pain on injection. No increased resistance to injection. Injection made in 5cc increments. Good needle visualization. Patient tolerated the procedure well. ? ? ? ? ?

## 2022-01-15 NOTE — Op Note (Signed)
01/15/2022 ? ?PATIENT:  Laveda Norman   ? ?PRE-OPERATIVE DIAGNOSIS:  Closed right ankle fracture and dislocation ? ?POST-OPERATIVE DIAGNOSIS:  Same ? ?PROCEDURE:  ORIF right lateral malleolus fracture ? ?SURGEON:  Willaim Sheng, MD ? ?PHYSICIAN ASSISTANT: none ? ?ANESTHESIA:   LMA and regional block ? ?ESTIMATED BLOOD LOSS: 10cc ? ?PREOPERATIVE INDICATIONS:  REOLA SCHUENKE is a  61 y.o. female with a diagnosis of Right ankle fracture and dislocation that was closed reduced in the ED who elected for surgical management to minimize the risk for malunion and nonunion and post-traumatic arthritis.   ? ?The risks benefits and alternatives were discussed with the patient preoperatively including but not limited to the risks of infection, bleeding, nerve injury, cardiopulmonary complications, the need for revision surgery, the need for hardware removal, among others, and the patient was willing to proceed. ? ?OPERATIVE IMPLANTS:  ?Arthrex stainless steel anatomic fibula plate ?Implant Name Type Inv. Item Serial No. Manufacturer Lot No. LRB No. Used Action  ?PLATE LOCK DIST FIB 5H RT - [id number] Plate PLATE LOCK DIST FIB 5H RT  [employer]  Right 1 Implanted  ?SCREW LOCKING 2.7X16MM - [id number] Screw SCREW LOCKING 2.7X16MM  [employer]  Right 3 Implanted  ?SCREW LOW PROFILE 3.5X14 - [id number] Screw SCREW LOW PROFILE 3.5X14  [employer]  Right 3 Implanted  ?SCREW LOCKING 2.7X14MM - [id number] Screw SCREW LOCKING 2.7X14MM  [employer]  Right 1 Implanted  ?SCREW LOCKING 2.7X10 ANKLE - [id number] Screw SCREW LOCKING 2.7X10 ANKLE  [employer]  Right 1 Implanted  ? ? ?OPERATIVE PROCEDURE: The patient was brought to the operating room and placed in the supine position. All bony prominences were padded. Non sterile Tourniquet was placed but not inflated. General anesthesia was administered. The lower extremity was prepped and draped in the usual sterile fashion.  Time out was performed.  ? ?Incision was made over the distal  fibula and the fracture was exposed and reduced anatomically with a clamp. A lag screw was placed. Dissection for the plate proximally was performed with metz to bluntly spread and to protect crossing branches of the superficial peroneal nerve which was visualized to be crossing anteriorly across the fibula at the top of the incision. I then applied an appropriately sized distal fibular locking plate and secured it proximally with non locking screws and distally with locking screws. Bone quality was good. I used c-arm to confirm satisfactory reduction and fixation.  ? ?The syndesmosis was stressed using live fluoroscopy and found to be stable. ? ?The wounds were irrigated, and closed with 0 vicryl,  2-0 vicryl.  3-0 nylon closure for the skin. 500mg  vancomcycin powder was placed into the wound. Adaptic and Sterile gauze was applied followed by a posterior splint. She was awakened and returned to the PACU in stable and satisfactory condition. There were no complications. ? ?Post op recs: ?WB: NWB LLE ?Imaging: PACU xrays ?Dressing: keep splint intact until follow up ?DVT prophylaxis: aspirin 81mg  BID x4 weeks ?Follow up: 2 weeks after surgery for a wound check and suture removal with Dr. Zachery Dakins at [facility].  ?Address: [address] [place], [place], [place] [address]  ?Office Phone: [phone] ? ?Charlies Constable, MD ?Orthopaedic Surgery ? ?  ?

## 2022-01-15 NOTE — Anesthesia Procedure Notes (Signed)
Anesthesia Regional Block: Popliteal block  ? ?Pre-Anesthetic Checklist: , timeout performed,  Correct Patient, Correct Site, Correct Laterality,  Correct Procedure, Correct Position, site marked,  Risks and benefits discussed,  Surgical consent,  Pre-op evaluation,  At surgeon's request and post-op pain management ? ?Laterality: Right ? ?Prep: chloraprep     ?  ?Needles:  ?Injection technique: Single-shot ? ?Needle Type: Echogenic Needle   ? ? ?Needle Length: 10cm  ?Needle Gauge: 21  ? ? ? ?Additional Needles: ? ? ?Narrative:  ?Start time: 01/15/2022 7:20 AM ?End time: 01/15/2022 7:23 AM ?Injection made incrementally with aspirations every 5 mL. ? ?Performed by: Personally  ?Anesthesiologist: Beryle Lathe, MD ? ?Additional Notes: ?No pain on injection. No increased resistance to injection. Injection made in 5cc increments. Good needle visualization. Patient tolerated the procedure well. ? ? ? ? ?

## 2022-01-15 NOTE — Anesthesia Procedure Notes (Signed)
Procedure Name: LMA Insertion ?Date/Time: 01/15/2022 7:45 AM ?Performed by: Shireen Quan, CRNA ?Pre-anesthesia Checklist: Patient identified, Emergency Drugs available, Suction available and Patient being monitored ?Patient Re-evaluated:Patient Re-evaluated prior to induction ?Oxygen Delivery Method: Circle System Utilized ?Preoxygenation: Pre-oxygenation with 100% oxygen ?Induction Type: IV induction ?Ventilation: Mask ventilation without difficulty ?LMA: LMA inserted ?LMA Size: 5.0 ?Number of attempts: 1 ?Placement Confirmation: positive ETCO2 ?Tube secured with: Tape ?Dental Injury: Teeth and Oropharynx as per pre-operative assessment  ? ? ? ? ?

## 2022-01-15 NOTE — Anesthesia Postprocedure Evaluation (Signed)
Anesthesia Post Note ? ?Patient: Stacey Mullins ? ?Procedure(s) Performed: OPEN REDUCTION INTERNAL FIXATION (ORIF) ANKLE FRACTURE (Right: Ankle) ? ?  ? ?Patient location during evaluation: PACU ?Anesthesia Type: General ?Level of consciousness: awake and alert ?Pain management: pain level controlled ?Vital Signs Assessment: post-procedure vital signs reviewed and stable ?Respiratory status: spontaneous breathing, nonlabored ventilation and respiratory function stable ?Cardiovascular status: stable and blood pressure returned to baseline ?Anesthetic complications: no ? ? ?No notable events documented. ? ?Last Vitals:  ?Vitals:  ? 01/15/22 0925 01/15/22 0940  ?BP: (!) 141/65 135/70  ?Pulse: 83 77  ?Resp: 18 (!) 9  ?Temp:    ?SpO2: 97% 96%  ?  ?Last Pain:  ?Vitals:  ? 01/15/22 0925  ?TempSrc:   ?PainSc: 0-No pain  ? ? ?  ?  ?  ?  ?  ?  ? ?Beryle Lathe ? ? ? ? ?

## 2022-01-15 NOTE — Transfer of Care (Signed)
Immediate Anesthesia Transfer of Care Note ? ?Patient: Stacey Mullins ? ?Procedure(s) Performed: OPEN REDUCTION INTERNAL FIXATION (ORIF) ANKLE FRACTURE (Right: Ankle) ? ?Patient Location: PACU ? ?Anesthesia Type:GA combined with regional for post-op pain ? ?Level of Consciousness: drowsy and patient cooperative ? ?Airway & Oxygen Therapy: Patient Spontanous Breathing and Patient connected to nasal cannula oxygen ? ?Post-op Assessment: Report given to RN, Post -op Vital signs reviewed and stable and Patient moving all extremities ? ?Post vital signs: Reviewed and stable ? ?Last Vitals:  ?Vitals Value Taken Time  ?BP    ?Temp    ?Pulse 82 01/15/22 0912  ?Resp 13 01/15/22 0912  ?SpO2 98 % 01/15/22 0912  ?Vitals shown include unvalidated device data. ? ?Last Pain:  ?Vitals:  ? 01/15/22 0623  ?TempSrc: Oral  ?PainSc: 0-No pain  ?   ? ?Patients Stated Pain Goal: 3 (01/15/22 [id number]) ? ?Complications: No notable events documented. ?

## 2022-01-15 NOTE — Discharge Instructions (Addendum)
Diet: As you were doing prior to hospitalization  ? ?Shower: If you have a splint on, leave the splint in place and keep the splint dry with a plastic bag. ? ?Dressing:   If you have a splint, then just leave the splint in place and we will change your bandages during your first follow-up appointment.  If water gets in the splint or the splint gets saturated please call the clinic and we can see you to change your splint. ? ?Activity:  Increase activity slowly as tolerated, but follow the weight bearing instructions below.  The rules on driving is that you can not be taking narcotics while you drive, and you must feel in control of the vehicle.   ? ?Weight Bearing:   non weight bearing in the splint.   ? ?Blood clot prevention (DVT Prophylaxis): After surgery you are at an increased risk for a blood clot. you were prescribed a blood thinner, aspirin 81mg  to be taken twice daily for a total of 4 weeks from surgery to help reduce your risk of getting a blood clot. This will help prevent a blood clot. Signs of a pulmonary embolus (blood clot in the lungs) include sudden short of breath, feeling lightheaded or dizzy, chest pain with a deep breath, rapid pulse rapid breathing. Signs of a blood clot in your arms or legs include new unexplained swelling and cramping, warm, red or darkened skin around the painful area. Please call the office or 911 right away if these signs or symptoms develop. ?To prevent constipation: you may use a stool softener such as - ? ?Colace (over the counter) 100 mg by mouth twice a day  ?Drink plenty of fluids (prune juice may be helpful) and high fiber foods ?Miralax (over the counter) for constipation as needed.   ? ?Itching:  If you experience itching with your medications, try taking only a single pain pill, or even half a pain pill at a time.  You may take up to 10 pain pills per day, and you can also use benadryl over the counter for itching or also to help with sleep.  ? ?Precautions:  If  you experience chest pain or shortness of breath - call 911 immediately for transfer to the hospital emergency department!! ? ? ?Call office [phone]) for the following: ?Temperature greater than 101F ?Persistent nausea and vomiting ?Severe uncontrolled pain ?Redness, tenderness, or signs of infection (pain, swelling, redness, odor or green/yellow discharge around the site) ?Difficulty breathing, headache or visual disturbances ?Hives ?Persistent dizziness or light-headedness ?Extreme fatigue ?Any other questions or concerns you may have after discharge ? ?In an emergency, call 911 or go to an Emergency Department at a nearby hospital ? ?Follow- Up Appointment:  Please call for an appointment to be seen approximately 2-3 week after surgery in [facility] with your surgeon [facility] - [phone] ?Address: [address], [place], [place] ? ? ?  ?

## 2022-01-17 ENCOUNTER — Encounter (HOSPITAL_COMMUNITY): Payer: Self-pay | Admitting: Orthopedic Surgery

## 2022-06-01 ENCOUNTER — Ambulatory Visit (INDEPENDENT_AMBULATORY_CARE_PROVIDER_SITE_OTHER): Payer: Commercial Managed Care - PPO | Admitting: Obstetrics and Gynecology

## 2022-06-01 ENCOUNTER — Encounter: Payer: Self-pay | Admitting: Obstetrics and Gynecology

## 2022-06-01 VITALS — BP 113/75 | HR 82 | Ht 69.25 in | Wt 207.0 lb

## 2022-06-01 DIAGNOSIS — M62838 Other muscle spasm: Secondary | ICD-10-CM

## 2022-06-01 DIAGNOSIS — R350 Frequency of micturition: Secondary | ICD-10-CM

## 2022-06-01 DIAGNOSIS — N941 Unspecified dyspareunia: Secondary | ICD-10-CM

## 2022-06-01 DIAGNOSIS — N952 Postmenopausal atrophic vaginitis: Secondary | ICD-10-CM | POA: Diagnosis not present

## 2022-06-01 LAB — POCT URINALYSIS DIPSTICK
Bilirubin, UA: NEGATIVE
Glucose, UA: NEGATIVE
Ketones, UA: NEGATIVE
Leukocytes, UA: NEGATIVE
Nitrite, UA: NEGATIVE
Protein, UA: POSITIVE — AB
Spec Grav, UA: 1.02 (ref 1.010–1.025)
Urobilinogen, UA: 0.2 E.U./dL
pH, UA: 5.5 (ref 5.0–8.0)

## 2022-06-01 MED ORDER — ESTRADIOL 0.1 MG/GM VA CREA: TOPICAL_CREAM | VAGINAL | 11 refills | Status: AC

## 2022-06-01 NOTE — Patient Instructions (Signed)
Start vaginal estrogen therapy nightly for two weeks then 2 times weekly at night for treatment of vaginal atrophy (dryness of the vaginal tissues).  Please let us know if the prescription is too expensive and we can look for alternative options.   

## 2022-06-01 NOTE — Progress Notes (Signed)
Urogynecology New Patient Evaluation and Consultation  Referring Provider: Laurann Montana, MD PCP: Laurann Montana, MD Date of Service: 06/01/2022  SUBJECTIVE Chief Complaint: New Patient (Initial Visit) Stacey Mullins is a 61 y.o. female complains of vaginal pain during sex. Pt said it feels like glass is scraping. )  History of Present Illness: Stacey Mullins is a 61 y.o. White or Caucasian female seen in consultation at the request of Dr. Cliffton Asters for evaluation of dyspareunia.    Review of records significant for: Has IGA neprhopathy, and has hematuria/ proteinuria. Followed by nephrology.   Urinary Symptoms: Does not leak urine.   Day time voids 5.  Nocturia: 1 times per night to void. Voiding dysfunction: she empties her bladder well.  does not use a catheter to empty bladder.  When urinating, she feels dribbling after finishing   UTIs:  0  UTI's in the last year.   Reports history of blood in urine- has IgA nephropathy  Pelvic Organ Prolapse Symptoms:                  She Denies a feeling of a bulge the vaginal area.   Bowel Symptom: Bowel movements: 2 time(s) per day Stool consistency: soft  Straining: no.  Splinting: no.  Incomplete evacuation: no.  She Denies accidental bowel leakage / fecal incontinence Bowel regimen: diet  Sexual Function Sexually active: yes.  Sexual orientation: Straight Pain with sex: at the vaginal opening, no pain deeper inside Has a lot of sensitivity at the opening- feels like glass shards. Has been occurring for about a year or more.  She is using KY jelly lubricant. She tried estrace cream before. She is not sure how often she was using it, maybe used for about a month.   Pelvic Pain Denies pelvic pain    Past Medical History:  Past Medical History:  Diagnosis Date   Anemia    Arthritis    hands - osetoarthritis   Chronic kidney disease    proteinuria   Gout    Hypertension    Hypothyroid    Sleep apnea     Uses CPAP nightly   Thyroid disease      Past Surgical History:   Past Surgical History:  Procedure Laterality Date   EYE SURGERY Right    Laser   ORIF ANKLE FRACTURE Right 01/15/2022   Procedure: OPEN REDUCTION INTERNAL FIXATION (ORIF) ANKLE FRACTURE;  Surgeon: Joen Laura, MD;  Location: [facility];  Service: Orthopedics;  Laterality: Right;   RENAL BIOPSY     NGA nephropathy   WISDOM TOOTH EXTRACTION       Past OB/GYN History: OB History  Gravida Para Term Preterm AB Living  1         1  SAB IAB Ectopic Multiple Live Births          1    # Outcome Date GA Lbr Len/2nd Weight Sex Delivery Anes PTL Lv  1 Gravida             Vaginal deliveries: 1,  Forceps/ Vacuum deliveries: 0, Cesarean section: 0 Menopausal: Yes, at age 59, Denies vaginal bleeding since menopause Last pap smear was within the last year, reports normal.  Any history of abnormal pap smears: yes- had cryo procedure many years ago   Medications: She has a current medication list which includes the following prescription(s): allopurinol, amlodipine, aspirin ec, [START ON 06/02/2022] estradiol, ferrous sulfate, hydrocodone-acetaminophen, levothyroxine, loratadine, losartan, multivitamin with minerals,  and spironolactone.   Allergies: Patient is allergic to benicar [olmesartan], erythromycin, lisinopril, and omeprazole.   Social History:  Social History   Tobacco Use   Smoking status: Never   Smokeless tobacco: Never  Vaping Use   Vaping Use: Never used  Substance Use Topics   Alcohol use: No   Drug use: No    Relationship status: married She lives with husband.   She is employed. Regular exercise: Yes:   History of abuse: No  Family History:   Family History  Problem Relation Age of Onset   Arthritis/Rheumatoid Mother    Thyroid disease Mother    Factor V Leiden deficiency Mother    Hypertension Father      Review of Systems: Review of Systems  Constitutional:  Negative for fever,  malaise/fatigue and weight loss.  Respiratory:  Negative for cough, shortness of breath and wheezing.   Cardiovascular:  Negative for chest pain, palpitations and leg swelling.  Gastrointestinal:  Negative for abdominal pain and blood in stool.  Genitourinary:  Negative for dysuria.  Musculoskeletal:  Negative for myalgias.  Skin:  Negative for rash.  Neurological:  Positive for dizziness. Negative for headaches.  Endo/Heme/Allergies:  Bruises/bleeds easily.  Psychiatric/Behavioral:  Negative for depression. The patient is not nervous/anxious.      OBJECTIVE Physical Exam: Vitals:   06/01/22 0854  BP: 113/75  Pulse: 82  Weight: 207 lb (93.9 kg)  Height: 5' 9.25" (1.759 m)    Physical Exam Constitutional:      General: She is not in acute distress. Pulmonary:     Effort: Pulmonary effort is normal.  Abdominal:     General: There is no distension.     Palpations: Abdomen is soft.     Tenderness: There is no abdominal tenderness. There is no rebound.  Musculoskeletal:        General: No swelling. Normal range of motion.  Skin:    General: Skin is warm and dry.     Findings: No rash.  Neurological:     Mental Status: She is alert and oriented to person, place, and time.  Psychiatric:        Mood and Affect: Mood normal.        Behavior: Behavior normal.     GU / Detailed Urogynecologic Evaluation:  Pelvic Exam: Normal external female genitalia; Bartholin's and Skene's glands normal in appearance; urethral meatus normal in appearance, no urethral masses or discharge.   CST: negative  Q tip test- tenderness in vestibule from approximately 4- 9 o'clock.   Speculum exam reveals normal vaginal mucosa with atrophy. Cervix normal appearance. Uterus normal single, nontender. Adnexa no mass, fullness, tenderness.    Pelvic floor strength II/V  Pelvic floor musculature: Right levator tender, Right obturator non-tender, Left levator tender, Left obturator non-tender  POP-Q:    POP-Q  -3                                            Aa   -3                                           Ba  -9  C   3                                            Gh  4                                            Pb  10                                            tvl   -2                                            Ap  -2                                            Bp  -9                                              D     Rectal Exam:  Normal external rectum  Post-Void Residual (PVR) by Bladder Scan: In order to evaluate bladder emptying, we discussed obtaining a postvoid residual and she agreed to this procedure.  Procedure: The ultrasound unit was placed on the patient's abdomen in the suprapubic region after the patient had voided. A PVR of 3 ml was obtained by bladder scan.  Laboratory Results: POC urine: moderate blood, positive protein   ASSESSMENT AND PLAN Ms. Saggese is a 61 y.o. with:  1. Dyspareunia, female   2. Urinary frequency   3. Levator spasm   4. Vaginal atrophy    - Dyspareunia is likely from a combination of vaginal atrophy and levator spasm.  - Advised to start estrace cream 0.5g nightly for two weeks then twice a week after. Has used this previously but not consistently.  - The origin of pelvic floor muscle spasm can be multifactorial, including primary, reactive to a different pain source, trauma, or even part of a centralized pain syndrome.Treatment options include pelvic floor physical therapy, local (vaginal) or oral  muscle relaxants, pelvic muscle trigger point injections or centrally acting pain medications.  Will start with referral to pelvic physical therapy.   Return 3 months  Marguerita Beards, MD

## 2022-06-22 ENCOUNTER — Ambulatory Visit: Payer: Commercial Managed Care - PPO | Attending: Obstetrics and Gynecology | Admitting: Physical Therapy

## 2022-06-22 ENCOUNTER — Encounter: Payer: Self-pay | Admitting: Physical Therapy

## 2022-06-22 ENCOUNTER — Other Ambulatory Visit: Payer: Self-pay

## 2022-06-22 DIAGNOSIS — R293 Abnormal posture: Secondary | ICD-10-CM | POA: Insufficient documentation

## 2022-06-22 DIAGNOSIS — M6281 Muscle weakness (generalized): Secondary | ICD-10-CM | POA: Insufficient documentation

## 2022-06-22 DIAGNOSIS — M62838 Other muscle spasm: Secondary | ICD-10-CM | POA: Diagnosis present

## 2022-06-22 NOTE — Therapy (Signed)
OUTPATIENT PHYSICAL THERAPY FEMALE PELVIC EVALUATION   Patient Name: Stacey Mullins MRN: [id number] DOB:[birth year], 61 y.o., female Today's Date: 06/22/2022   PT End of Session - 06/22/22 1446     Visit Number 1    Date for PT Re-Evaluation 09/14/22    Authorization Type [employer]    PT Start Time 1400    PT Stop Time 1443    PT Time Calculation (min) 43 min    Activity Tolerance No increased pain;Patient tolerated treatment well    Behavior During Therapy [facility] for tasks assessed/performed             Past Medical History:  Diagnosis Date   Anemia    Arthritis    hands - osetoarthritis   Chronic kidney disease    proteinuria   Gout    Hypertension    Hypothyroid    Sleep apnea    Uses CPAP nightly   Thyroid disease    Past Surgical History:  Procedure Laterality Date   EYE SURGERY Right    Laser   ORIF ANKLE FRACTURE Right 01/15/2022   Procedure: OPEN REDUCTION INTERNAL FIXATION (ORIF) ANKLE FRACTURE;  Surgeon: Willaim Sheng, MD;  Location: [place];  Service: Orthopedics;  Laterality: Right;   RENAL BIOPSY     NGA nephropathy   WISDOM TOOTH EXTRACTION     Patient Active Problem List   Diagnosis Date Noted   Transient alteration of awareness 09/09/2013   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   Gout    Hypothyroid     PCP: Harlan Stains, MD  REFERRING PROVIDER: Jaquita Folds, MD   REFERRING DIAG: N94.10 (ICD-10-CM) - Dyspareunia, female  THERAPY DIAG:  Other muscle spasm  Muscle weakness (generalized)  Abnormal posture  Rationale for Evaluation and Treatment Rehabilitation  ONSET DATE:  a couple of years  SUBJECTIVE:                                                                                                                                                                                           SUBJECTIVE STATEMENT: Per MD note patient has post void dribble and pain with intercourse like glass scraping at vaginal opening and deeper  in the vagina.  She was given estrogen for vaginal application at recent visit. Fluid intake: Yes: mostly water     PAIN:  Are you having pain? Yes NPRS scale: 4/10 Pain location: Internal, External, Deep, and Vaginal  Pain type: burning and sharp Pain description: intermittent   Aggravating factors: anything touching the area Relieving factors: not touching the area  PRECAUTIONS: None  WEIGHT BEARING RESTRICTIONS No  FALLS:  Has patient fallen in last 6 months? No  LIVING ENVIRONMENT: Lives with: lives with their family and lives with their spouse Lives in: House/apartment   OCCUPATION: full time; sitting and licensing for insurance  PLOF: Independent  PATIENT GOALS able to have intercourse without pain  PERTINENT HISTORY:  Episiotymy and hemorrhoids Sexual abuse: No  BOWEL MOVEMENT   URINATION Pain with urination: No  Leakage:  post void dribble sometimes Pads: No  INTERCOURSE Pain with intercourse: Initial Penetration, During Penetration, and After Intercourse just when using the bathroom Ability to have vaginal penetration:  Yes: but I do not most of the time because it hurts a lot Climax: some Marinoff Scale: 3/3  PREGNANCY Vaginal deliveries 1 Tearing Yes: episiotomy   PROLAPSE None    OBJECTIVE:   DIAGNOSTIC FINDINGS:    PATIENT SURVEYS:    PFIQ-7   COGNITION:  Overall cognitive status: Within functional limits for tasks assessed     SENSATION:    MUSCLE LENGTH: Hamstrings: Right WFL  deg; Left WFL  deg  +pain at end range bil Thomas test:   LUMBAR SPECIAL TESTS:  ASLR negative  FUNCTIONAL TESTS:    GAIT:  Comments: WFL                POSTURE: rounded shoulders and increased thoracic kyphosis   PELVIC ALIGNMENT:  LUMBARAROM/PROM  A/PROM A/PROM  eval  Flexion 75%  Extension   Right lateral flexion   Left lateral flexion   Right rotation   Left rotation    (Blank rows = not tested)  LOWER EXTREMITY  ROM:  Passive ROM Right eval Left eval  Hip flexion [facility]   Hip extension    Hip abduction    Hip adduction    Hip internal rotation    Hip external rotation 50% 75%  Knee flexion    Knee extension    Ankle dorsiflexion    Ankle plantarflexion    Ankle inversion    Ankle eversion     (Blank rows = not tested)  LOWER EXTREMITY MMT:  MMT Right eval Left eval  Hip flexion 4/5 5/5  Hip extension    Hip abduction 3/5 4/5  Hip adduction 4/5 5/5  Hip internal rotation    Hip external rotation    Knee flexion    Knee extension    Ankle dorsiflexion    Ankle plantarflexion    Ankle inversion    Ankle eversion      PALPATION:   General  pubic symphisis and inguinal attacchments TTP                External Perineal Exam  pale color vaginal vestibule; Rt side more TTP                             Internal Pelvic Floor 2/5 for 2 sec; 4 reps  Patient confirms identification and approves PT to assess internal pelvic floor and treatment Yes  PELVIC MMT:   MMT eval  Vaginal 2/5  Internal Anal Sphincter   External Anal Sphincter   Puborectalis   Diastasis Recti   (Blank rows = not tested)        TONE: high  PROLAPSE: Not noticed, but no valsalva performed  TODAY'S TREATMENT  EVAL and initial HEP with sample for wash and moisturizing   PATIENT EDUCATION:  Education details: HEP on medbridge Person educated: Patient Education method: Explanation, Demonstration, Tactile cues,  Verbal cues, and Handouts Education comprehension: verbalized understanding and returned demonstration   HOME EXERCISE PROGRAM: Access Code: [email or web address] URL: [email or web address] Date: 06/22/2022 Prepared by: Dwana Curd  Exercises - Supine Hip Internal and External Rotation  - 1 x daily - 7 x weekly - 1 sets - 10 reps - 5 sec hold - Diaphragmatic Breathing at 90/90 Supported  - 1 x daily - 7 x weekly - 1 sets - 10 reps  ASSESSMENT:  CLINICAL  IMPRESSION: Patient is a 61 y.o. female who was seen today for physical therapy evaluation and treatment for dyspareunia. Pt has pelvic pain of soft tissues with light touch more in the labia minora and vaginal vestibular region; tight levators; skin coloring pale and reduced blood flow to the vestibule. Pt has pelvic floor and hip weakness. She has decreased endurance of the pelvic floor as well.  Pt will benefit from skilled PT to address impairment   OBJECTIVE IMPAIRMENTS decreased coordination, decreased endurance, decreased ROM, decreased strength, increased muscle spasms, impaired flexibility, impaired tone, postural dysfunction, and pain.   ACTIVITY LIMITATIONS continence and toileting  PARTICIPATION LIMITATIONS: interpersonal relationship  PERSONAL FACTORS 1-2 comorbidities: Episiotymy and hemorrhoids  are also affecting patient's functional outcome.   REHAB POTENTIAL: Excellent  CLINICAL DECISION MAKING: Stable/uncomplicated  EVALUATION COMPLEXITY: Low   GOALS: Goals reviewed with patient? Yes  SHORT TERM GOALS: Target date: 07/20/2022  Ind with initial HEP Baseline: Goal status: INITIAL   LONG TERM GOALS: Target date: 09/14/2022   Pt will be independent with advanced HEP to maintain improvements made throughout therapy  Baseline:  Goal status: INITIAL  2.  Pt will report 75% reduction of pain due to improvements in posture, strength, and muscle length  Baseline:  Goal status: INITIAL  3.  Pt will have 1/3 or lower score of Marinoff scale  Baseline:  Goal status: INITIAL  4.  Pt will be ind with positional techniques to avoid pain during intercourse Baseline:  Goal status: INITIAL  5.  Pt will report not having difficulty emptying bladder and not having any post void dribble due to improved soft tissue of pelvic floor Baseline:  Goal status: INITIAL PLAN: PT FREQUENCY: 1x/week  PT DURATION: 12 weeks  PLANNED INTERVENTIONS: Therapeutic exercises,  Therapeutic activity, Neuromuscular re-education, Balance training, Gait training, Patient/Family education, Self Care, Joint mobilization, Dry Needling, Electrical stimulation, Cryotherapy, Moist heat, Taping, Biofeedback, Manual therapy, and Re-evaluation  PLAN FOR NEXT SESSION: internal STM and release to fascia around vestibule and levators bilat; see if can get to coccygeus and obturators, thoracic mobility and posture; lumbar release and mobility   [employer], PT 06/22/2022, 5:19 PM

## 2022-07-20 ENCOUNTER — Ambulatory Visit: Payer: Commercial Managed Care - PPO | Admitting: Physical Therapy

## 2022-07-21 ENCOUNTER — Ambulatory Visit: Payer: Commercial Managed Care - PPO | Attending: Obstetrics and Gynecology | Admitting: Physical Therapy

## 2022-07-21 ENCOUNTER — Encounter: Payer: Self-pay | Admitting: Physical Therapy

## 2022-07-21 DIAGNOSIS — M62838 Other muscle spasm: Secondary | ICD-10-CM | POA: Insufficient documentation

## 2022-07-21 DIAGNOSIS — R293 Abnormal posture: Secondary | ICD-10-CM | POA: Insufficient documentation

## 2022-07-21 DIAGNOSIS — M6281 Muscle weakness (generalized): Secondary | ICD-10-CM | POA: Insufficient documentation

## 2022-07-21 NOTE — Therapy (Addendum)
OUTPATIENT PHYSICAL THERAPY FEMALE PELVIC TREATMENT   Patient Name: Stacey Mullins MRN: [id number] DOB:1961-03-13, 61 y.o., female Today's Date: 07/21/2022   PT End of Session - 07/21/22 1140     Visit Number 2    Date for PT Re-Evaluation 09/14/22    Authorization Type [employer]    PT Start Time [id number]    PT Stop Time 1211    PT Time Calculation (min) 38 min    Activity Tolerance No increased pain;Patient tolerated treatment well    Behavior During Therapy [facility] for tasks assessed/performed              Past Medical History:  Diagnosis Date   Anemia    Arthritis    hands - osetoarthritis   Chronic kidney disease    proteinuria   Gout    Hypertension    Hypothyroid    Sleep apnea    Uses CPAP nightly   Thyroid disease    Past Surgical History:  Procedure Laterality Date   EYE SURGERY Right    Laser   ORIF ANKLE FRACTURE Right 01/15/2022   Procedure: OPEN REDUCTION INTERNAL FIXATION (ORIF) ANKLE FRACTURE;  Surgeon: Willaim Sheng, MD;  Location: [facility];  Service: Orthopedics;  Laterality: Right;   RENAL BIOPSY     NGA nephropathy   WISDOM TOOTH EXTRACTION     Patient Active Problem List   Diagnosis Date Noted   Transient alteration of awareness 09/09/2013   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 I have not been doing the stretches, I have to admit.  EVAL SUBJECTIVE  STATEMENT: Per MD note patient has post void dribble and pain with intercourse like glass scraping at vaginal opening and deeper in the vagina.  She was given estrogen for vaginal application at recent visit. Fluid intake: Yes: mostly water     PAIN:  Are you having pain? Yes NPRS scale: 4/10 Pain location: Internal, External, Deep, and Vaginal  Pain type: burning and sharp Pain description: intermittent  Aggravating factors: anything touching the area Relieving factors: not touching the area  PRECAUTIONS: None  WEIGHT BEARING RESTRICTIONS No  FALLS:  Has patient fallen in last 6 months? No  LIVING ENVIRONMENT: Lives with: lives with their family and lives with their spouse Lives in: House/apartment   OCCUPATION: full time; sitting and licensing for insurance  PLOF: Independent  PATIENT GOALS able to have intercourse without pain  PERTINENT HISTORY:  Episiotymy and hemorrhoids Sexual abuse: No  BOWEL MOVEMENT   URINATION Pain with urination: No  Leakage:  post void dribble sometimes Pads: No  INTERCOURSE Pain with intercourse: Initial Penetration, During Penetration, and After Intercourse just when using the bathroom Ability to have vaginal penetration:  Yes: but I do not most of the time because it hurts a lot Climax: some Marinoff Scale: 3/3  PREGNANCY Vaginal deliveries 1 Tearing Yes: episiotomy   PROLAPSE None    OBJECTIVE:   DIAGNOSTIC FINDINGS:    PATIENT SURVEYS:    PFIQ-7   COGNITION:  Overall cognitive status: Within functional limits for tasks assessed     SENSATION:    MUSCLE LENGTH: Hamstrings: Right WFL  deg; Left WFL  deg  +pain at end range bil Thomas test:   LUMBAR SPECIAL TESTS:  ASLR negative  FUNCTIONAL TESTS:    GAIT:  Comments: WFL                POSTURE: rounded shoulders and increased thoracic kyphosis   PELVIC ALIGNMENT:  LUMBARAROM/PROM  A/PROM A/PROM  eval  Flexion 75%  Extension   Right  lateral flexion   Left lateral flexion   Right rotation   Left rotation    (Blank rows = not tested)  LOWER EXTREMITY ROM:  Passive ROM Right eval Left eval  Hip flexion [facility]   Hip extension    Hip abduction    Hip adduction    Hip internal rotation    Hip external rotation 50% 75%  Knee flexion    Knee extension    Ankle dorsiflexion    Ankle plantarflexion    Ankle inversion    Ankle eversion     (Blank rows = not tested)  LOWER EXTREMITY MMT:  MMT Right eval Left eval  Hip flexion 4/5 5/5  Hip extension    Hip abduction 3/5 4/5  Hip adduction 4/5 5/5  Hip internal rotation    Hip external rotation    Knee flexion    Knee extension    Ankle dorsiflexion    Ankle plantarflexion    Ankle inversion    Ankle eversion      PALPATION:   General  pubic symphisis and inguinal attacchments TTP                External Perineal Exam  pale color vaginal vestibule; Rt side more TTP                             Internal Pelvic Floor 2/5 for 2 sec; 4 reps  Patient confirms identification and approves PT to assess internal pelvic floor and treatment Yes  PELVIC MMT:   MMT eval  Vaginal 2/5  Internal Anal Sphincter   External Anal Sphincter   Puborectalis   Diastasis Recti   (Blank rows = not tested)        TONE: high  PROLAPSE: Not noticed, but no valsalva performed  TODAY'S TREATMENT  Date: 07/21/22 Manual: Patient confirms identification  and approves physical therapist to perform internal soft tissue work  - bil levators and ileococcygeus - Rt side more tight than left but left more "pinchy" - decreased tenderness after doing STM  Nuero Re-ed: Education and cues for coordination of breathing and relaxing with exercises below  Exercises:  Piriformis stretch Happy baby stretch Hip rotation Breathing into diaphragm and bulging pelvic floor  PATIENT EDUCATION:  Education details: Access Code: [email or web address] Person educated: Patient Education method:  Explanation, Demonstration, Tactile cues, Verbal cues, and Handouts Education comprehension: verbalized understanding and returned demonstration   HOME EXERCISE PROGRAM: Access Code: [email or web address] URL: [email or web address] Date: 07/21/2022 Prepared by: Jari Favre  Exercises - Supine Hip Internal and External Rotation  - 1 x daily - 7 x weekly - 1 sets - 10 reps - 5 sec hold - Diaphragmatic Breathing at 90/90 Supported  - 1 x daily - 7 x weekly - 1 sets - 10 reps - Supine Figure 4 Piriformis Stretch  - 1 x daily - 7 x weekly - 1 sets - 3 reps - 30 sec hold - Supine Pelvic Floor Stretch - Hands on Knees  - 1 x daily - 7 x weekly - 1 sets - 3 reps - 30 hold  ASSESSMENT:  CLINICAL IMPRESSION: Patient was less tender to palpation today and had improved muscle length and tone after doing soft tissue work.  She was able to tolerate STM to bil levators, ileococcygeus and palpation of obturator internus.  Pt was given stretches with breathing techniques to maintain muscle improvements that were made during today's session.  Continue with skilled PT recommended for full return to functional activities.  OBJECTIVE IMPAIRMENTS decreased coordination, decreased endurance, decreased ROM, decreased strength, increased muscle spasms, impaired flexibility, impaired tone, postural dysfunction, and pain.   ACTIVITY LIMITATIONS continence and toileting  PARTICIPATION LIMITATIONS: interpersonal relationship  PERSONAL FACTORS 1-2 comorbidities: Episiotymy and hemorrhoids  are also affecting patient's functional outcome.   REHAB POTENTIAL: Excellent  CLINICAL DECISION MAKING: Stable/uncomplicated  EVALUATION COMPLEXITY: Low   GOALS: Goals reviewed with patient? Yes  SHORT TERM GOALS: Target date: 07/20/2022  Ind with initial HEP Baseline: Goal status: IN PROGRESS   LONG TERM GOALS: Target date: 09/14/2022   Pt will be independent with advanced HEP to maintain improvements  made throughout therapy  Baseline:  Goal status: INITIAL  2.  Pt will report 75% reduction of pain due to improvements in posture, strength, and muscle length  Baseline:  Goal status: INITIAL  3.  Pt will have 1/3 or lower score of Marinoff scale  Baseline:  Goal status: INITIAL  4.  Pt will be ind with positional techniques to avoid pain during intercourse Baseline:  Goal status: INITIAL  5.  Pt will report not having difficulty emptying bladder and not having any post void dribble due to improved soft tissue of pelvic floor Baseline:  Goal status: INITIAL PLAN: PT FREQUENCY: 1x/week  PT DURATION: 12 weeks  PLANNED INTERVENTIONS: Therapeutic exercises, Therapeutic activity, Neuromuscular re-education, Balance training, Gait training, Patient/Family education, Self Care, Joint mobilization, Dry Needling, Electrical stimulation, Cryotherapy, Moist heat, Taping, Biofeedback, Manual therapy, and Re-evaluation  PLAN FOR NEXT SESSION: internal STM #2 to reassess for trigger points and  thoracic mobility and posture and core strength; lumbar release and mobility   [employer], PT 07/21/2022, 12:31 PM   PHYSICAL THERAPY DISCHARGE SUMMARY  Visits from Start of Care: 2  Current functional level related to goals / functional outcomes: See above goals   Remaining  deficits: See above   Education / Equipment: HEP   Patient agrees to discharge. Patient goals were not met. Patient is being discharged due to not returning since the last visit.  Gustavus Bryant, PT, DPT 10/07/22 11:33 AM

## 2022-07-25 ENCOUNTER — Encounter: Payer: Self-pay | Admitting: *Deleted

## 2022-07-27 ENCOUNTER — Ambulatory Visit: Payer: Commercial Managed Care - PPO | Admitting: Physical Therapy

## 2022-08-03 ENCOUNTER — Ambulatory Visit: Payer: Commercial Managed Care - PPO | Admitting: Physical Therapy

## 2022-08-10 ENCOUNTER — Ambulatory Visit: Payer: Commercial Managed Care - PPO | Admitting: Physical Therapy

## 2022-08-17 ENCOUNTER — Encounter: Payer: Commercial Managed Care - PPO | Admitting: Physical Therapy

## 2022-08-30 ENCOUNTER — Encounter: Payer: Self-pay | Admitting: Obstetrics and Gynecology

## 2022-08-30 ENCOUNTER — Other Ambulatory Visit (HOSPITAL_COMMUNITY)
Admission: RE | Admit: 2022-08-30 | Discharge: 2022-08-30 | Disposition: A | Discharge status: Home / Self Care | Payer: Commercial Managed Care - PPO | Source: Ambulatory Visit | Attending: Obstetrics and Gynecology | Admitting: Obstetrics and Gynecology

## 2022-08-30 ENCOUNTER — Ambulatory Visit (INDEPENDENT_AMBULATORY_CARE_PROVIDER_SITE_OTHER): Payer: Commercial Managed Care - PPO | Admitting: Obstetrics and Gynecology

## 2022-08-30 VITALS — BP 133/80 | HR 82

## 2022-08-30 DIAGNOSIS — N898 Other specified noninflammatory disorders of vagina: Secondary | ICD-10-CM | POA: Insufficient documentation

## 2022-08-30 DIAGNOSIS — B3731 Acute candidiasis of vulva and vagina: Secondary | ICD-10-CM | POA: Diagnosis not present

## 2022-08-30 NOTE — Progress Notes (Signed)
[facility] Return Visit  SUBJECTIVE  History of Present Illness: Stacey Mullins is a 61 y.o. female seen in follow-up for dyspareunia. Plan at last visit was to start pelvic PT and use vaginal estrogen cream.   She went to two physical therapy appointments but then her husband was hospitalized for a blood clot and they both got covid, so she was not able to keep her other appointments. They have not been sexually active recently so dyspareunia is not an issue.   She is having some irritation on the external vulva. She was using an antibacterial wash then changed to summer's eve. She is using estrogen twice a weeks and using it internally and some externally. She is using (Jergens) lotion externally otherwise on the vulva, was previously using hydrocortisone. Feels some itching and burning around the urethra but does not have any issues with urination.   Past Medical History: Patient  has a past medical history of Anemia, Arthritis, Chronic kidney disease, Gout, Hypertension, Hypothyroid, Sleep apnea, and Thyroid disease.   Past Surgical History: She  has a past surgical history that includes Renal biopsy; Eye surgery (Right); Wisdom tooth extraction; and ORIF ankle fracture (Right, 01/15/2022).   Medications: She has a current medication list which includes the following prescription(s): allopurinol, amlodipine, aspirin ec, estradiol, ferrous sulfate, hydrocodone-acetaminophen, levothyroxine, loratadine, losartan, multivitamin with minerals, and spironolactone.   Allergies: Patient is allergic to benicar [olmesartan], erythromycin, lisinopril, and omeprazole.   Social History: Patient  reports that she has never smoked. She has never used smokeless tobacco. She reports that she does not drink alcohol and does not use drugs.      OBJECTIVE     Physical Exam: Vitals:   08/30/22 0908 08/30/22 0909 08/30/22 0910  BP: (!) 156/81 (!) 144/84 133/80  Pulse: 83 82 82   Gen:  No apparent distress, A&O x 3.  Detailed Urogynecologic Evaluation:  Normal external genitalia. White discharge present at introitus and on inner labia minora. No erythema present. Aptima swab obtained.    ASSESSMENT AND PLAN    Stacey Mullins is a 61 y.o. with:  1. Vaginal itching    - Discussed vulvar hygiene- wash with only water, avoid soaps. In addition to estrace cream twice a week, use daily moisturizer such as vitamin E cream or coconut oil (suggestions provided).  - Aptima swab sent today- will contact her with results.  - Discussed that we can refer to pelvic PT again when she is ready.   Follow up as needed  Marguerita Beards, MD

## 2022-08-30 NOTE — Patient Instructions (Signed)
Vulvovaginal moisturizer Options: Vitamin E oil (pump or capsule) or cream (Gene's Vit E Cream) Coconut oil V-magic (on [employer]- has oils) Water based moisturizers- Good Clean love, KY jelly Silicone-based lubricant for use during intercourse ("wet platinum" is a brand available at most drugstores) Crisco Consider the ingredients of the product - the fewer the ingredients the better!  Directions for Use: Clean and dry your hands Gently dab the vulvar/vaginal area dry as needed Apply a "pea-sized" amount of the moisturizer onto your fingertip Using you other hand, open the labia  Apply the moisturizer to the vulvar/vaginal tissues Wear loose fitting underwear/clothing if possible following application Use moisturize up to 3 times daily as desired.

## 2022-08-31 ENCOUNTER — Telehealth: Payer: Self-pay | Admitting: Obstetrics and Gynecology

## 2022-08-31 LAB — CERVICOVAGINAL ANCILLARY ONLY
Bacterial Vaginitis (gardnerella): NEGATIVE
Candida Glabrata: NEGATIVE
Candida Vaginitis: POSITIVE — AB
Comment: NEGATIVE
Comment: NEGATIVE
Comment: NEGATIVE

## 2022-08-31 MED ORDER — FLUCONAZOLE 150 MG PO TABS: 150.0000 mg | ORAL_TABLET | Freq: Once | ORAL | 0 refills | Status: AC

## 2022-08-31 NOTE — Telephone Encounter (Signed)
Called and informed patient that she has a yeast infection and that she has Diflucan awaiting her at the pharmacy. Patient reports understanding.

## 2022-08-31 NOTE — Addendum Note (Signed)
Addended by: Marguerita Beards on: 08/31/2022 04:13 PM   Modules accepted: Orders

## 2022-10-02 IMAGING — DX DG KNEE COMPLETE 4+V*R*
4 series · 4 of 4 positions shown · non-contrast
Comparison: None.

CLINICAL DATA: Fall, ankle injury

EXAM:
RIGHT KNEE - COMPLETE 4+ VIEW

[knee ap]
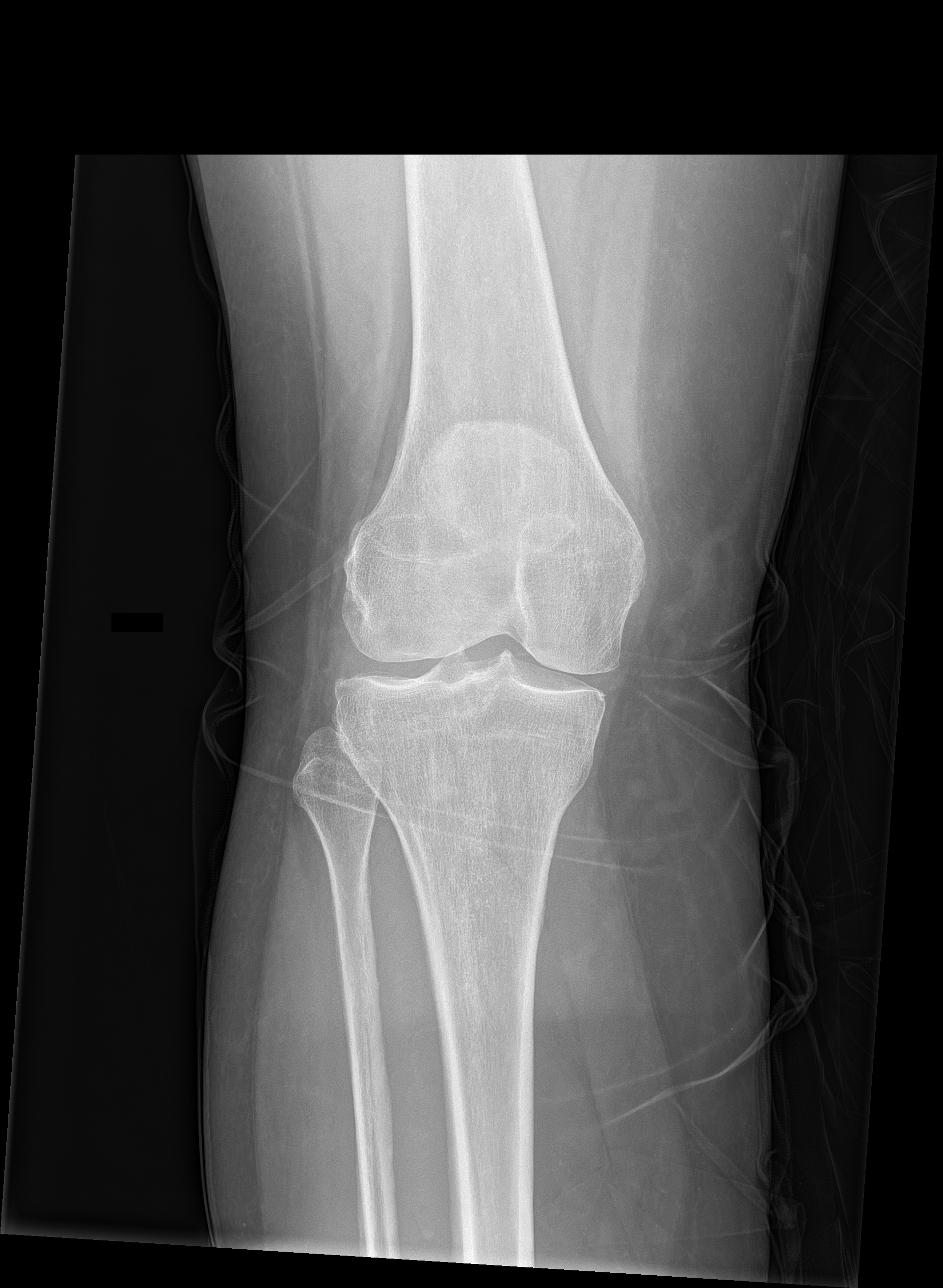

[knee lat]
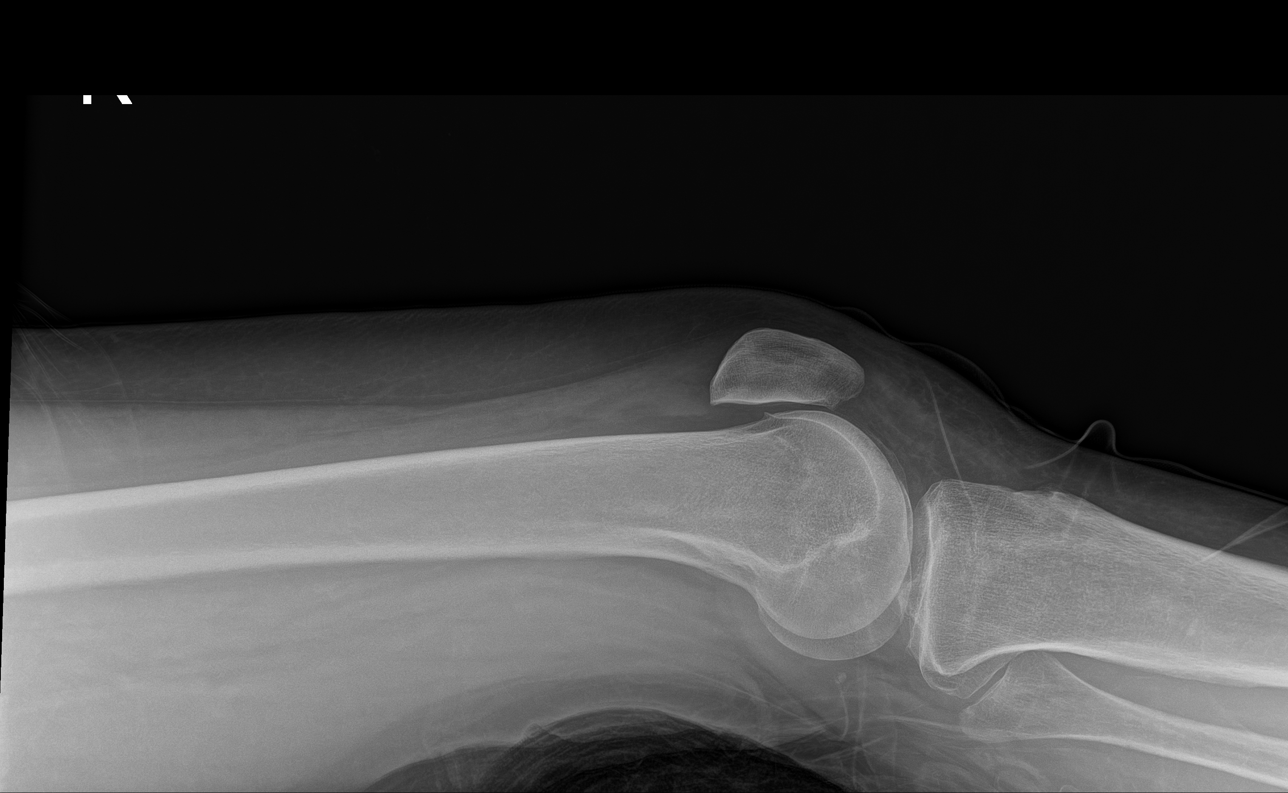

[knee obl (1 of 2)]
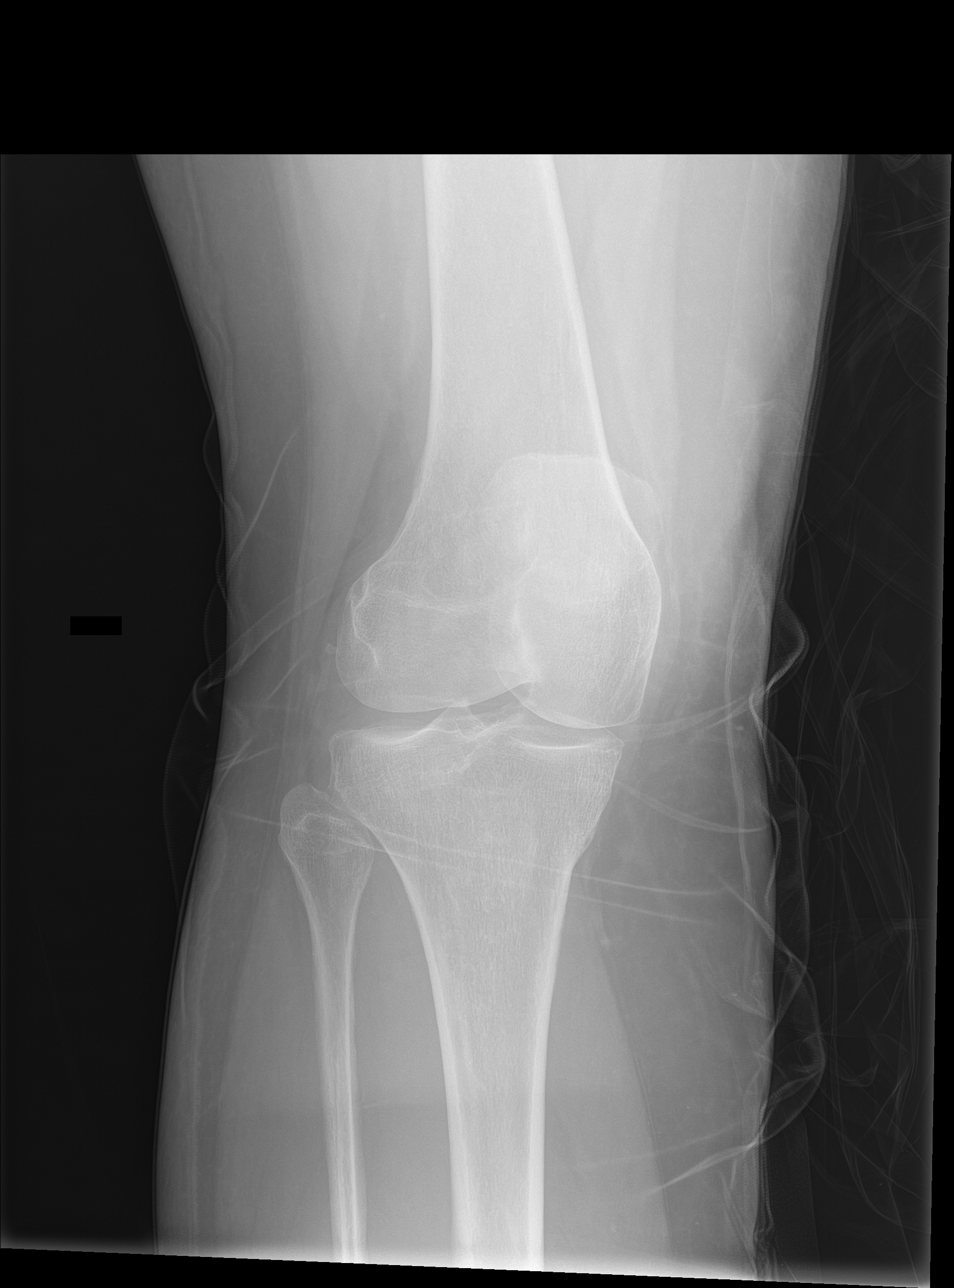

[knee obl (2 of 2)]
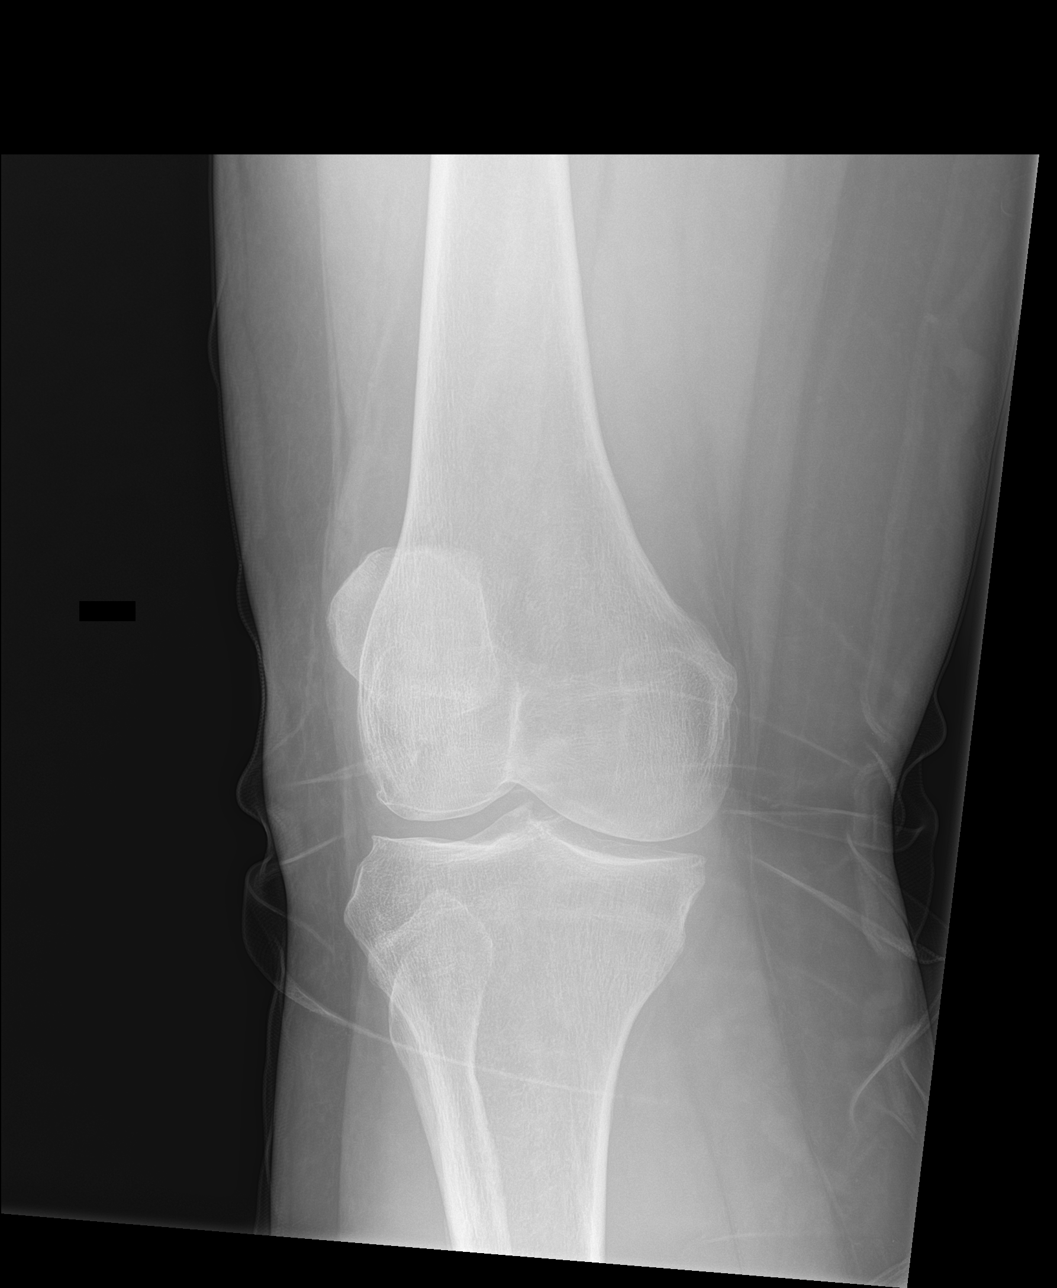

[4 of 4 positions shown; findings below may reference images not displayed]

FINDINGS: No evidence of fracture, dislocation, or joint effusion. No evidence
of arthropathy or other focal bone abnormality. Soft tissues are
unremarkable.
IMPRESSION: No fracture or dislocation of the right knee. Joint spaces are
preserved. Position

## 2022-10-10 IMAGING — RF DG ANKLE 2V *R*
1 series · 10 of 10 positions shown · non-contrast
Comparison: 01/07/2022

CLINICAL DATA: Fluoroscopic assistance for internal fixation of
fracture of fibula

EXAM:
RIGHT ANKLE - 2 VIEW

[Series 1: run · 10 of 10 slices shown]
[im 1/10]
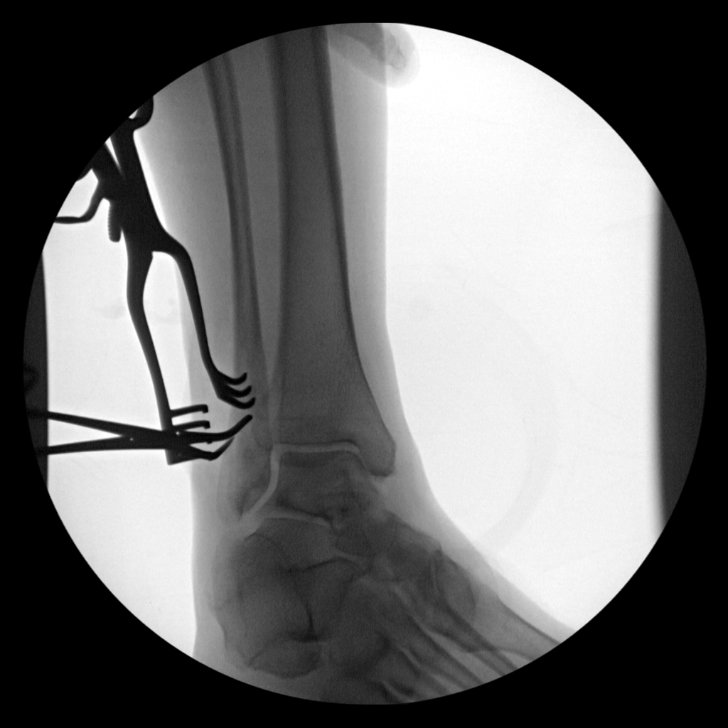
[im 2/10]
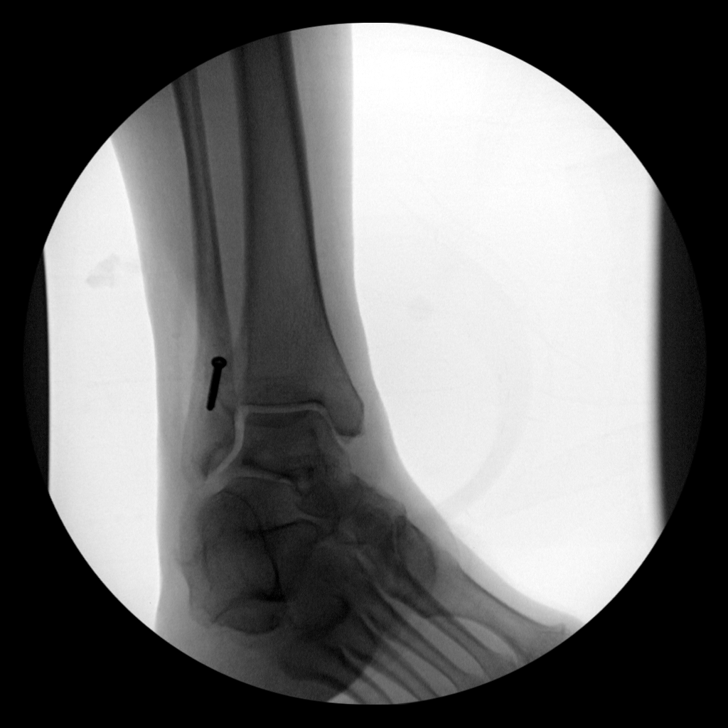
[im 3/10]
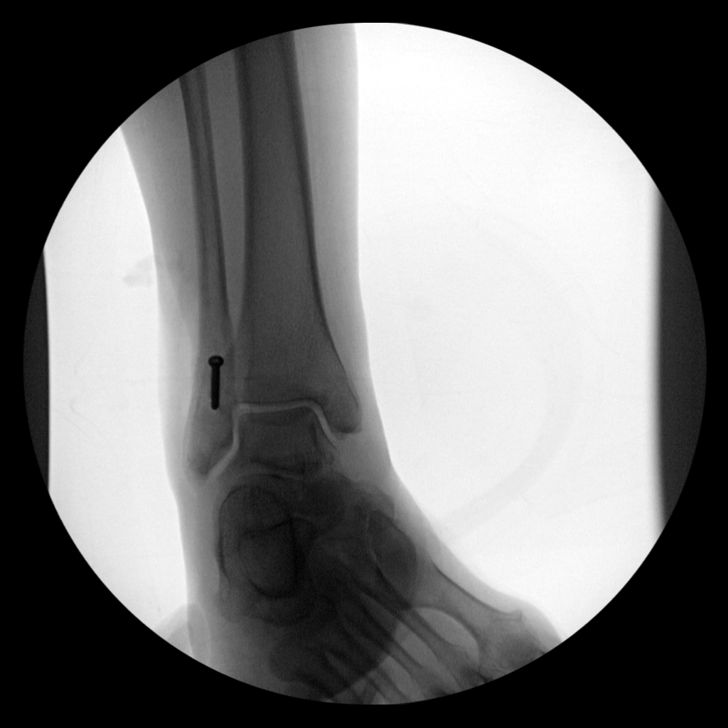
[im 4/10]
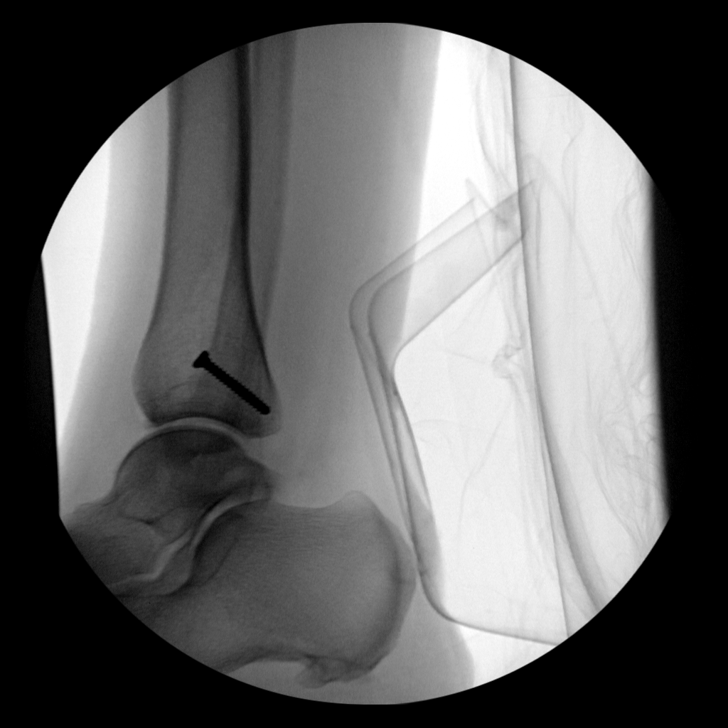
[im 5/10]
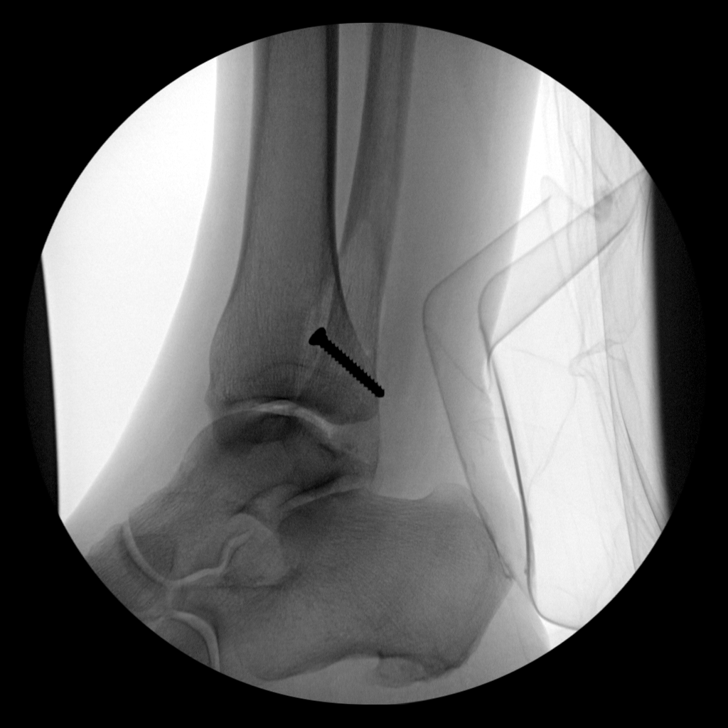
[im 6/10]
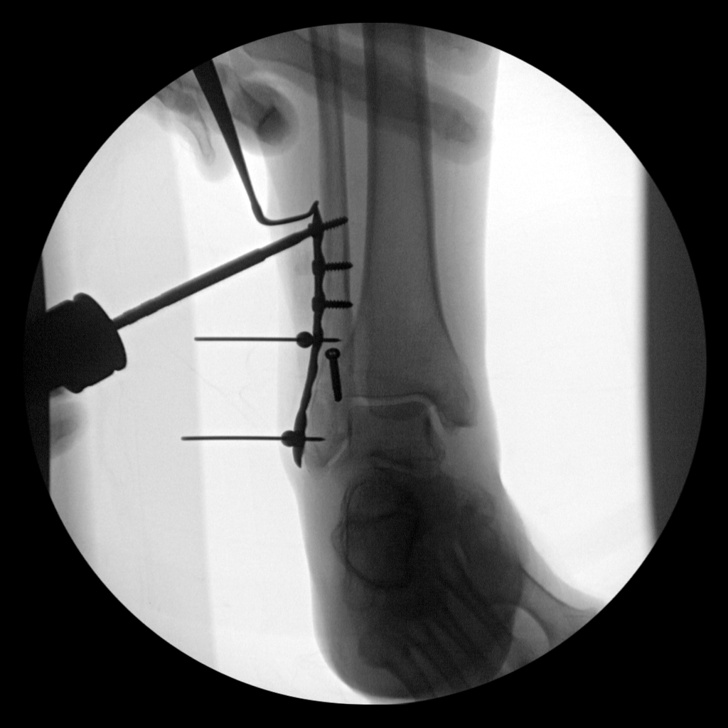
[im 7/10]
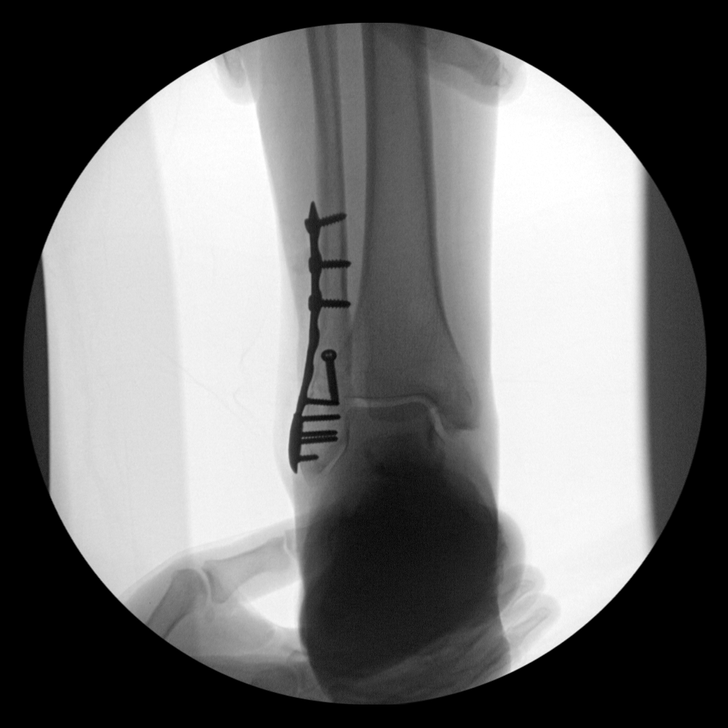
[im 8/10]
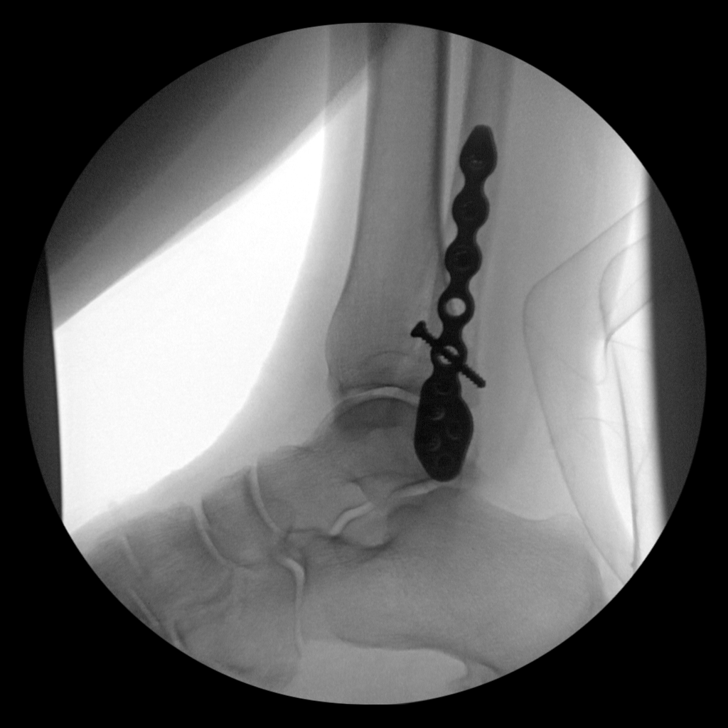
[im 9/10]
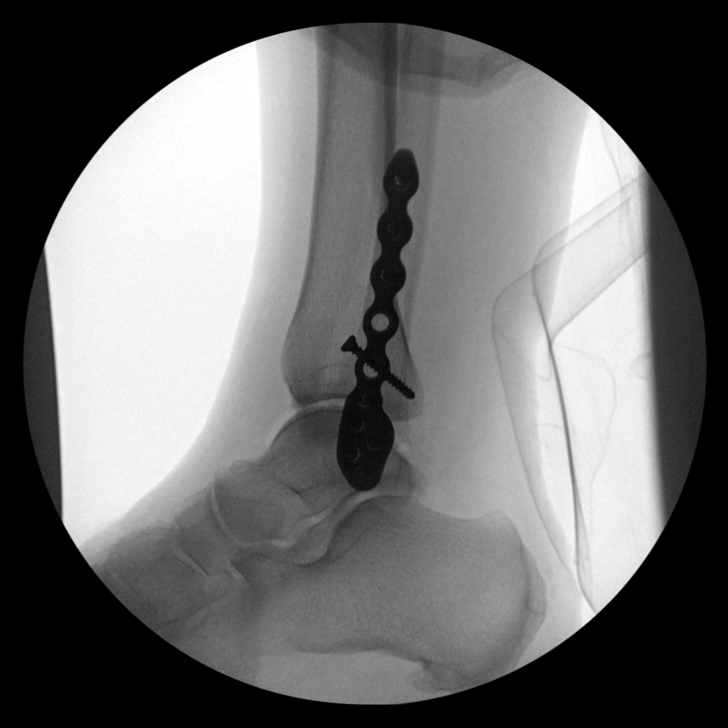
[im 10/10]
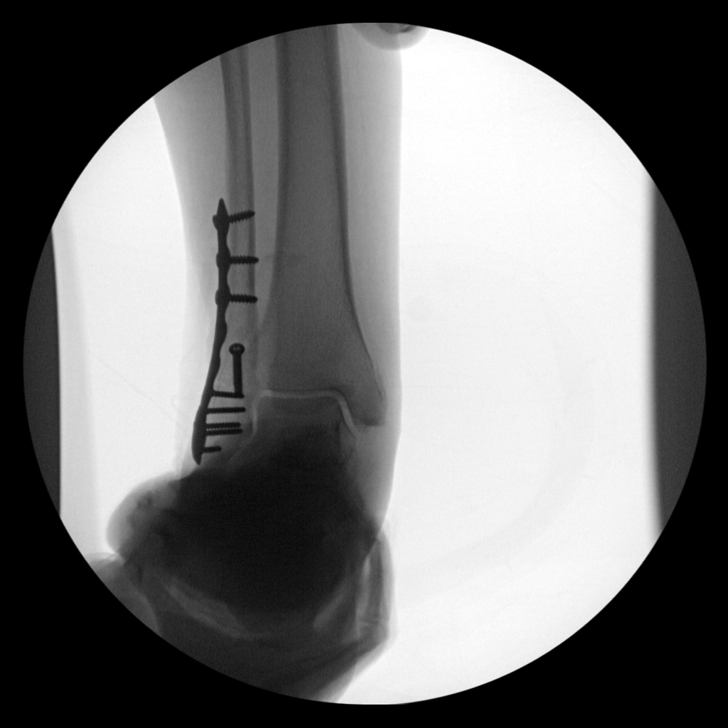

[10 of 10 positions shown; findings below may reference images not displayed]

FINDINGS: Fluoroscopic images show internal fixation of fracture of distal
fibula with metallic plate and multiple screws. Fluoroscopic time
was 51 seconds. Radiation dose was 1.11 mGy.
IMPRESSION: Fluoroscopic assistance was provided for internal fixation of distal
right fibula.

## 2023-02-06 ENCOUNTER — Other Ambulatory Visit: Payer: Self-pay | Admitting: Family Medicine

## 2023-02-06 DIAGNOSIS — Z1231 Encounter for screening mammogram for malignant neoplasm of breast: Secondary | ICD-10-CM

## 2023-02-15 ENCOUNTER — Ambulatory Visit
Admission: RE | Admit: 2023-02-15 | Discharge: 2023-02-15 | Disposition: A | Discharge status: Home / Self Care | Payer: Commercial Managed Care - PPO | Source: Ambulatory Visit | Attending: Family Medicine | Admitting: Family Medicine

## 2023-02-15 DIAGNOSIS — Z1231 Encounter for screening mammogram for malignant neoplasm of breast: Secondary | ICD-10-CM

## 2023-07-18 ENCOUNTER — Other Ambulatory Visit: Payer: Self-pay | Admitting: Family Medicine

## 2023-07-18 ENCOUNTER — Encounter: Payer: Self-pay | Admitting: Family Medicine

## 2023-07-18 DIAGNOSIS — E2839 Other primary ovarian failure: Secondary | ICD-10-CM

## 2023-09-11 ENCOUNTER — Other Ambulatory Visit: Payer: Self-pay | Admitting: Family Medicine

## 2023-09-11 DIAGNOSIS — Z1231 Encounter for screening mammogram for malignant neoplasm of breast: Secondary | ICD-10-CM

## 2024-02-09 ENCOUNTER — Encounter (HOSPITAL_COMMUNITY): Payer: Self-pay

## 2024-02-13 ENCOUNTER — Ambulatory Visit
Admission: RE | Admit: 2024-02-13 | Discharge: 2024-02-13 | Disposition: A | Discharge status: Home / Self Care | Payer: Commercial Managed Care - PPO | Source: Ambulatory Visit | Attending: Family Medicine | Admitting: Family Medicine

## 2024-02-13 DIAGNOSIS — E2839 Other primary ovarian failure: Secondary | ICD-10-CM

## 2024-02-16 ENCOUNTER — Ambulatory Visit
Admission: RE | Admit: 2024-02-16 | Discharge: 2024-02-16 | Disposition: A | Discharge status: Home / Self Care | Payer: Commercial Managed Care - PPO | Source: Ambulatory Visit | Attending: Family Medicine | Admitting: Family Medicine

## 2024-02-16 DIAGNOSIS — Z1231 Encounter for screening mammogram for malignant neoplasm of breast: Secondary | ICD-10-CM

## 2024-07-23 ENCOUNTER — Other Ambulatory Visit (HOSPITAL_COMMUNITY): Payer: Self-pay | Admitting: Family Medicine

## 2024-07-23 ENCOUNTER — Other Ambulatory Visit (HOSPITAL_BASED_OUTPATIENT_CLINIC_OR_DEPARTMENT_OTHER): Payer: Self-pay | Admitting: Family Medicine

## 2024-07-23 DIAGNOSIS — R7989 Other specified abnormal findings of blood chemistry: Secondary | ICD-10-CM

## 2024-07-23 DIAGNOSIS — R0789 Other chest pain: Secondary | ICD-10-CM

## 2024-07-23 DIAGNOSIS — D6851 Activated protein C resistance: Secondary | ICD-10-CM

## 2024-07-24 ENCOUNTER — Ambulatory Visit (HOSPITAL_COMMUNITY)
Admission: RE | Admit: 2024-07-24 | Discharge: 2024-07-24 | Disposition: A | Discharge status: Home / Self Care | Source: Ambulatory Visit | Attending: Cardiology | Admitting: Cardiology

## 2024-07-24 ENCOUNTER — Ambulatory Visit (HOSPITAL_COMMUNITY)
Admission: RE | Admit: 2024-07-24 | Discharge: 2024-07-24 | Disposition: A | Discharge status: Home / Self Care | Source: Ambulatory Visit | Attending: Family Medicine | Admitting: Family Medicine

## 2024-07-24 ENCOUNTER — Ambulatory Visit (HOSPITAL_COMMUNITY): Admission: RE | Admit: 2024-07-24 | Discharge: 2024-07-24 | Attending: Family Medicine | Admitting: Family Medicine

## 2024-07-24 DIAGNOSIS — D6851 Activated protein C resistance: Secondary | ICD-10-CM

## 2024-07-24 DIAGNOSIS — R7989 Other specified abnormal findings of blood chemistry: Secondary | ICD-10-CM

## 2024-07-24 DIAGNOSIS — R0789 Other chest pain: Secondary | ICD-10-CM

## 2024-07-24 MED ORDER — TECHNETIUM TO 99M ALBUMIN AGGREGATED
4.4000 | Freq: Once | INTRAVENOUS | Status: AC | PRN
Start: 2024-07-24 — End: 2024-07-24
  Administered 2024-07-24: 4.4 via INTRAVENOUS

## 2024-09-03 ENCOUNTER — Encounter: Payer: Self-pay | Admitting: Student in an Organized Health Care Education/Training Program

## 2024-09-03 ENCOUNTER — Ambulatory Visit
Attending: Student in an Organized Health Care Education/Training Program | Admitting: Student in an Organized Health Care Education/Training Program

## 2024-09-03 VITALS — BP 110/84 | HR 74 | Ht 69.25 in | Wt 184.0 lb

## 2024-09-03 DIAGNOSIS — I2510 Atherosclerotic heart disease of native coronary artery without angina pectoris: Secondary | ICD-10-CM | POA: Diagnosis not present

## 2024-09-03 DIAGNOSIS — R931 Abnormal findings on diagnostic imaging of heart and coronary circulation: Secondary | ICD-10-CM | POA: Diagnosis not present

## 2024-09-03 DIAGNOSIS — I272 Pulmonary hypertension, unspecified: Secondary | ICD-10-CM

## 2024-09-03 DIAGNOSIS — E782 Mixed hyperlipidemia: Secondary | ICD-10-CM

## 2024-09-03 NOTE — Progress Notes (Signed)
 Cardiology Office Note:   Date:  09/03/2024  ID:  Stacey Mullins, DOB [birth year], MRN [id number] PCP: Teresa Channel, MD  [facility] Providers Cardiologist:  Georganna Archer, MD { Chief Complaint:  Chief Complaint  Patient presents with   Establish Care      History of Present Illness:   Stacey Mullins is a 63 y.o. female with a PMH of elevated CAC, HTN, OSA (on CPAP), CKD 4 who presents as a new patient referral by Dr. Channel Teresa for the evaluation of elevated CAC score and dilated main PA.  The patient was in her usual state of health until a few months ago she developed some atypical chest discomfort.  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  These lasted a few seconds and were short-lived.  She went to talk to her PCP about its and had another episode of the sharp pains while in the PCP office.  She notes that over the past few years she has had these sporadic sharp pains, but nothing sustained.  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  No exertional chest pain with activity.  She further denies SOB, PND, orthopnea, swelling, palpitations, syncope or presyncope.  She underwent CAC scoring for evaluation which demonstrated a calcium score elevated to 444.  Her main PA was also mildly dilated at 3.0 cm concerning for pulmonary hypertension.  She is accompanied by her husband today.  Family history is notable for her father who had CHF and her grandmother who had a prior CVA.  She denies tobacco, alcohol, illicit drug use.  She was started on a baby aspirin and atorvastatin by her PCP given her elevated CAC score.  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  The patient has stage IV CKD and undergoes annual stress testing as a part of transplant listing.  She underwent an NM SPECT  stress on 07/26/2023 which was normal.  No further concerns.     Past Medical History:  Diagnosis Date   Anemia    Arthritis    hands - osetoarthritis   Chronic kidney disease    proteinuria   Gout    Hypertension    Hypothyroid    Sleep apnea    Uses CPAP nightly   Thyroid  disease      Studies Reviewed:    EKG:  EKG Interpretation Date/Time:  Tuesday September 03 2024 14:10:30 EST Ventricular Rate:  74 PR Interval:  152 QRS Duration:  86 QT Interval:  370 QTC Calculation: 410 R Axis:   27  Text Interpretation: Normal sinus rhythm Normal ECG When compared with ECG of 15-Jan-2022 06:45, No significant change was found Confirmed by Archer Georganna [phone]) on 09/03/2024 2:11:22 PM     Cardiac Studies & Procedures   ______________________________________________________________________________________________          CT SCANS  CT CARDIAC SCORING (SELF PAY ONLY) 07/24/2024  Addendum 07/30/2024  9:38 PM ADDENDUM REPORT: 07/30/2024 21:36  EXAM: OVER-READ INTERPRETATION  CT CHEST  The following report is an over-read performed by radiologist Dr. Andrea Gasman of [facility] on 07/30/2024. This over-read does not include interpretation of cardiac or coronary anatomy or pathology. The coronary calcium score interpretation by the cardiologist is attached.  COMPARISON:  None.  FINDINGS: Vascular:  No aortic atherosclerosis. The included aorta is normal in caliber.  Mediastinum/nodes: 11 x 16 mm anterior mediastinal nodule which may represent a lymph node. Small hiatal hernia.  Lungs: No focal airspace disease. No pulmonary nodule. No pleural fluid. The included airways are patent.  Upper abdomen: No acute findings. 5.5 cm fluid density structure in the left upper quadrant is likely an exophytic renal cyst, only partially included in the field of view.  Musculoskeletal: There are no acute or suspicious osseous abnormalities. Scoliosis and  thoracic spondylosis.  IMPRESSION: 1. Anterior mediastinal nodule measuring 11 x 16 mm, may represent a lymph node. Consider CT follow-up in 6 months. 2. Small hiatal hernia. 3. Probable exophytic left renal cyst, only partially included in the field of view. Recommend renal ultrasound for characterization.   Electronically Signed By: Andrea Gasman M.D. On: 07/30/2024 21:36  Narrative CLINICAL DATA:  Cardiovascular Disease Risk stratification  EXAM: Coronary Calcium Score  TECHNIQUE: A gated, non-contrast computed tomography scan of the heart was performed using 2.5 mm slice thickness. Axial images were analyzed on a dedicated workstation. Calcium scoring of the coronary arteries was performed using the Agatston method.  FINDINGS: Coronary Calcium Score:  Left main: 0  Left anterior descending artery: 0  Left circumflex artery: 0  Right coronary artery: 444  Total: 444  Percentile: 97th  Pericardium: Normal.  Aorta: Normal caliber.  Dilated main pulmonary artery to 30 mm, suggestive of possible pulmonary hypertension.  Non-cardiac: See separate report from [facility].  IMPRESSION: 1. Coronary calcium score of 444. This was 97th percentile for age-, race-, and sex-matched controls.  2. Dilated main pulmonary artery to 30 mm, suggestive of possible pulmonary hypertension  3. Aortic atherosclerosis.  RECOMMENDATIONS: Coronary artery calcium (CAC) score is a strong predictor of incident coronary heart disease (CHD) and provides predictive information beyond traditional risk factors. CAC scoring is reasonable to use in the decision to withhold, postpone, or initiate statin therapy in intermediate-risk or selected borderline-risk asymptomatic adults (age 47-75 years and LDL-C >=70 to <190 mg/dL) who do not have diabetes or established atherosclerotic cardiovascular disease (ASCVD).* In intermediate-risk (10-year ASCVD risk >=7.5% to <20%)  adults or selected borderline-risk (10-year ASCVD risk >=5% to <7.5%) adults in whom a CAC score is measured for the purpose of making a treatment decision the following recommendations have been made:  If CAC=0, it is reasonable to withhold statin therapy and reassess in 5 to 10 years, as long as higher risk conditions are absent (diabetes mellitus, family history of premature CHD in first degree relatives (males <55 years; females <65 years), cigarette smoking, or LDL >=190 mg/dL).  If CAC is 1 to 99, it is reasonable to initiate statin therapy for patients >=13 years of age.  If CAC is >=100 or >=75th percentile, it is reasonable to initiate statin therapy at any age.  Cardiology referral should be considered for patients with CAC scores >=400 or >=75th percentile.  *2018 AHA/ACC/AACVPR/AAPA/ABC/ACPM/ADA/AGS/APhA/ASPC/NLA/PCNA Guideline on the Management of Blood Cholesterol: A Report of the American College of Cardiology/American Heart Association Task Force on Clinical Practice Guidelines. J Am Coll Cardiol. 2019;73(24):3168-3209.  Electronically Signed: By: Vinie JAYSON Maxcy M.D. On: 07/24/2024 17:36     ______________________________________________________________________________________________      Risk Assessment/Calculations:              Physical Exam:     VS:  BP 110/84 (BP Location: Left Arm, Patient Position: Sitting, Cuff Size: Large)   Pulse 74   Ht 5' 9.25 (  1.759 m)   Wt 184 lb (83.5 kg)   SpO2 98%   BMI 26.98 kg/m      Wt Readings from Last 3 Encounters:  06/01/22 207 lb (93.9 kg)  01/15/22 202 lb (91.6 kg)  01/07/22 202 lb 13.2 oz (92 kg)     GEN: Well nourished, well developed, in no acute distress NECK: No JVD; No carotid bruits CARDIAC: RRR, no murmurs, rubs, gallops RESPIRATORY:  Clear to auscultation without rales, wheezing or rhonchi  ABDOMEN: Soft, non-tender, non-distended, normal bowel sounds EXTREMITIES:  Warm and well  perfused, trace bilateral edema; No deformity, 2+ radial pulses PSYCH: Normal mood and affect   Assessment & Plan Coronary artery disease involving native coronary artery of native heart without angina pectoris - CAC score elevated to 444 which increases her risk for cardiovascular disease significantly. -Episodes of fleeting chest pain back in October; however, she has had no chest pain since and she is very physically active without any chest pain symptoms. -We discussed the risk/benefits of possible stress testing.  I feel that she does not need to undergo stress testing or invasive catheterization at this point given that she is currently chest pain-free and her symptoms were atypical in the first place.  Plus she had a negative stress test 1 year ago. -Additionally, I will be very hesitant to pursue LHC in this patient given her significant renal dysfunction and the risk for contrast-induced nephropathy. -We will continue to manage her with aggressive secondary prevention.  I counseled her to let me know if her chest pain does recur or changes in quality and then we can reconsider. Continue aspirin 81 mg every other day (I recommend it once a day but she prefers every other day due to bruising) Continue atorvastatin 20 mg daily for now  Pulmonary hypertension (HCC) - Noted to have mildly dilated main PA on CT imaging.  Note that this is suggestive of pulmonary hypertension but this is in no way shape or form diagnostic. -I will get a complete echocardiogram to look for RVSP to screen for evidence of pulmonary hypertension. -I suspect that her main PA is mildly dilated due to her underlying chronic OSA which is being treated by CPAP. Complete echo Mixed hyperlipidemia - Goal LDL is <70 given CAC score.  Last LDL in the 150s. Check lipid panel and LPA Continue atorvastatin 20 mg daily for now          This note was written with the assistance of a dictation microphone or AI dictation  software. Please excuse any typos or grammatical errors.   Signed, Georganna Archer, MD 09/03/2024 2:10 PM    [facility]

## 2024-09-03 NOTE — Patient Instructions (Signed)
 Medication Instructions:  Your physician recommends that you continue on your current medications as directed. Please refer to the Current Medication list given to you today.  *If you need a refill on your cardiac medications before your next appointment, please call your pharmacy*  Lab Work: Fasting Lipid panel & Lpa  Testing/Procedures: Your physician has requested that you have an echocardiogram. Echocardiography is a painless test that uses sound waves to create images of your heart. It provides your doctor with information about the size and shape of your heart and how well your heart's chambers and valves are working. This procedure takes approximately one hour. There are no restrictions for this procedure. Please do NOT wear cologne, perfume, aftershave, or lotions (deodorant is allowed). Please arrive 15 minutes prior to your appointment time.  Please note: We ask at that you not bring children with you during ultrasound (echo/ vascular) testing. Due to room size and safety concerns, children are not allowed in the ultrasound rooms during exams. Our front office staff cannot provide observation of children in our lobby area while testing is being conducted. An adult accompanying a patient to their appointment will only be allowed in the ultrasound room at the discretion of the ultrasound technician under special circumstances. We apologize for any inconvenience.   Follow-Up: At [facility], you and your health needs are our priority.  As part of our continuing mission to provide you with exceptional heart care, our providers are all part of one team.  This team includes your primary Cardiologist (physician) and Advanced Practice Providers or APPs (Physician Assistants and Nurse Practitioners) who all work together to provide you with the care you need, when you need it.  Your next appointment:   1 year(s)  Provider:   Dr. Floretta   We recommend signing up for the patient  portal called MyChart.  Sign up information is provided on this After Visit Summary.  MyChart is used to connect with patients for Virtual Visits (Telemedicine).  Patients are able to view lab/test results, encounter notes, upcoming appointments, etc.  Non-urgent messages can be sent to your provider as well.   To learn more about what you can do with MyChart, go to [email or web address].

## 2024-09-05 ENCOUNTER — Ambulatory Visit: Payer: Self-pay | Admitting: Student in an Organized Health Care Education/Training Program

## 2024-09-05 LAB — LIPID PANEL
Chol/HDL Ratio: 3.4 ratio (ref 0.0–4.4)
Cholesterol, Total: 191 mg/dL (ref 100–199)
HDL: 57 mg/dL (ref >39)
LDL Chol Calc (NIH): 110 mg/dL — ABNORMAL HIGH (ref 0–99)
Triglycerides: 136 mg/dL (ref 0–149)
VLDL Cholesterol Cal: 24 mg/dL (ref 5–40)

## 2024-09-05 LAB — LIPOPROTEIN A (LPA): Lipoprotein (a): 343.7 nmol/L — AB (ref <75.0)

## 2024-09-10 MED ORDER — ATORVASTATIN CALCIUM 40 MG PO TABS: 40.0000 mg | ORAL_TABLET | Freq: Every day | ORAL | 3 refills | Status: AC

## 2024-09-10 NOTE — Addendum Note (Signed)
 Addended by: MANDA BOTTCHER B on: 09/10/2024 05:19 PM   Modules accepted: Orders

## 2024-09-10 NOTE — Telephone Encounter (Signed)
 Script sent in

## 2024-10-21 ENCOUNTER — Ambulatory Visit (HOSPITAL_COMMUNITY)
Admission: RE | Admit: 2024-10-21 | Discharge: 2024-10-21 | Disposition: A | Source: Ambulatory Visit | Attending: Student in an Organized Health Care Education/Training Program | Admitting: Student in an Organized Health Care Education/Training Program

## 2024-10-21 DIAGNOSIS — I272 Pulmonary hypertension, unspecified: Secondary | ICD-10-CM | POA: Diagnosis not present

## 2024-10-21 LAB — ECHOCARDIOGRAM COMPLETE
Area-P 1/2: 3.36 cm2
S' Lateral: 2.6 cm
